# Patient Record
Sex: Male | Born: 1970 | Race: White | Hispanic: No | Marital: Married | Smoking: Never smoker
Health system: Southern US, Community
[De-identification: ages and names within clinical notes are randomized; demographics above are authoritative.]

## PROBLEM LIST (undated history)

## (undated) DIAGNOSIS — M1990 Unspecified osteoarthritis, unspecified site: Secondary | ICD-10-CM

## (undated) DIAGNOSIS — I10 Essential (primary) hypertension: Secondary | ICD-10-CM

## (undated) DIAGNOSIS — I499 Cardiac arrhythmia, unspecified: Secondary | ICD-10-CM

## (undated) DIAGNOSIS — M2550 Pain in unspecified joint: Secondary | ICD-10-CM

## (undated) DIAGNOSIS — R112 Nausea with vomiting, unspecified: Secondary | ICD-10-CM

## (undated) DIAGNOSIS — K219 Gastro-esophageal reflux disease without esophagitis: Secondary | ICD-10-CM

## (undated) DIAGNOSIS — N400 Enlarged prostate without lower urinary tract symptoms: Secondary | ICD-10-CM

## (undated) DIAGNOSIS — N200 Calculus of kidney: Secondary | ICD-10-CM

## (undated) DIAGNOSIS — Z9889 Other specified postprocedural states: Secondary | ICD-10-CM

## (undated) DIAGNOSIS — M797 Fibromyalgia: Secondary | ICD-10-CM

## (undated) DIAGNOSIS — K760 Fatty (change of) liver, not elsewhere classified: Secondary | ICD-10-CM

## (undated) DIAGNOSIS — E119 Type 2 diabetes mellitus without complications: Secondary | ICD-10-CM

## (undated) DIAGNOSIS — N189 Chronic kidney disease, unspecified: Secondary | ICD-10-CM

## (undated) HISTORY — PX: ROTATOR CUFF REPAIR: SHX139

## (undated) HISTORY — PX: TONSILLECTOMY: SUR1361

## (undated) HISTORY — PX: BACK SURGERY: SHX140

## (undated) HISTORY — PX: LITHOTRIPSY: SUR834

## (undated) HISTORY — PX: OTHER SURGICAL HISTORY: SHX169

---

## 1999-06-25 ENCOUNTER — Ambulatory Visit (HOSPITAL_COMMUNITY): Admission: RE | Admit: 1999-06-25 | Discharge: 1999-06-25 | Payer: Self-pay | Admitting: Urology

## 1999-06-25 ENCOUNTER — Encounter: Payer: Self-pay | Admitting: Urology

## 1999-08-05 ENCOUNTER — Encounter: Admission: RE | Admit: 1999-08-05 | Discharge: 1999-08-05 | Payer: Self-pay | Admitting: Infectious Diseases

## 1999-08-26 ENCOUNTER — Encounter: Admission: RE | Admit: 1999-08-26 | Discharge: 1999-08-26 | Payer: Self-pay | Admitting: Infectious Diseases

## 2000-05-20 ENCOUNTER — Ambulatory Visit (HOSPITAL_COMMUNITY): Admission: RE | Admit: 2000-05-20 | Discharge: 2000-05-20 | Payer: Self-pay | Admitting: Neurosurgery

## 2000-05-20 ENCOUNTER — Encounter: Payer: Self-pay | Admitting: Neurosurgery

## 2001-05-25 ENCOUNTER — Emergency Department (HOSPITAL_COMMUNITY): Admission: EM | Admit: 2001-05-25 | Discharge: 2001-05-26 | Payer: Self-pay | Admitting: Emergency Medicine

## 2002-09-20 ENCOUNTER — Encounter: Payer: Self-pay | Admitting: Neurosurgery

## 2002-09-20 ENCOUNTER — Encounter: Admission: RE | Admit: 2002-09-20 | Discharge: 2002-09-20 | Payer: Self-pay | Admitting: Neurosurgery

## 2002-10-04 ENCOUNTER — Encounter: Payer: Self-pay | Admitting: Neurosurgery

## 2002-10-04 ENCOUNTER — Encounter: Admission: RE | Admit: 2002-10-04 | Discharge: 2002-10-04 | Payer: Self-pay | Admitting: Neurosurgery

## 2002-10-12 ENCOUNTER — Encounter
Admission: RE | Admit: 2002-10-12 | Discharge: 2003-01-10 | Payer: Self-pay | Admitting: Physical Medicine & Rehabilitation

## 2003-01-12 ENCOUNTER — Encounter
Admission: RE | Admit: 2003-01-12 | Discharge: 2003-04-12 | Payer: Self-pay | Admitting: Physical Medicine & Rehabilitation

## 2003-01-25 ENCOUNTER — Encounter: Payer: Self-pay | Admitting: Neurosurgery

## 2003-01-25 ENCOUNTER — Ambulatory Visit (HOSPITAL_COMMUNITY): Admission: RE | Admit: 2003-01-25 | Discharge: 2003-01-26 | Payer: Self-pay | Admitting: Neurosurgery

## 2003-03-12 ENCOUNTER — Emergency Department (HOSPITAL_COMMUNITY): Admission: EM | Admit: 2003-03-12 | Discharge: 2003-03-12 | Payer: Self-pay | Admitting: Emergency Medicine

## 2003-07-19 ENCOUNTER — Encounter
Admission: RE | Admit: 2003-07-19 | Discharge: 2003-10-17 | Payer: Self-pay | Admitting: Physical Medicine & Rehabilitation

## 2003-12-11 ENCOUNTER — Encounter
Admission: RE | Admit: 2003-12-11 | Discharge: 2004-03-10 | Payer: Self-pay | Admitting: Physical Medicine & Rehabilitation

## 2007-04-19 ENCOUNTER — Encounter
Admission: RE | Admit: 2007-04-19 | Discharge: 2007-07-18 | Payer: Self-pay | Admitting: Physical Medicine & Rehabilitation

## 2007-04-20 ENCOUNTER — Ambulatory Visit: Payer: Self-pay | Admitting: Physical Medicine & Rehabilitation

## 2007-05-03 ENCOUNTER — Encounter
Admission: RE | Admit: 2007-05-03 | Discharge: 2007-06-10 | Payer: Self-pay | Admitting: Physical Medicine & Rehabilitation

## 2007-05-26 ENCOUNTER — Ambulatory Visit: Payer: Self-pay | Admitting: Physical Medicine & Rehabilitation

## 2007-06-11 ENCOUNTER — Ambulatory Visit: Payer: Self-pay | Admitting: Physical Medicine & Rehabilitation

## 2007-06-21 ENCOUNTER — Encounter: Admission: RE | Admit: 2007-06-21 | Discharge: 2007-09-19 | Payer: Self-pay | Admitting: Anesthesiology

## 2007-06-22 ENCOUNTER — Ambulatory Visit: Payer: Self-pay | Admitting: Anesthesiology

## 2007-06-29 ENCOUNTER — Ambulatory Visit (HOSPITAL_COMMUNITY)
Admission: RE | Admit: 2007-06-29 | Discharge: 2007-06-29 | Payer: Self-pay | Admitting: Physical Medicine & Rehabilitation

## 2007-08-04 ENCOUNTER — Ambulatory Visit: Payer: Self-pay | Admitting: Physical Medicine & Rehabilitation

## 2007-08-04 ENCOUNTER — Encounter
Admission: RE | Admit: 2007-08-04 | Discharge: 2007-11-02 | Payer: Self-pay | Admitting: Physical Medicine & Rehabilitation

## 2007-12-08 ENCOUNTER — Encounter
Admission: RE | Admit: 2007-12-08 | Discharge: 2008-03-07 | Payer: Self-pay | Admitting: Physical Medicine & Rehabilitation

## 2007-12-09 ENCOUNTER — Ambulatory Visit: Payer: Self-pay | Admitting: Physical Medicine & Rehabilitation

## 2008-01-28 ENCOUNTER — Ambulatory Visit: Payer: Self-pay | Admitting: Physical Medicine & Rehabilitation

## 2008-04-19 ENCOUNTER — Encounter
Admission: RE | Admit: 2008-04-19 | Discharge: 2008-07-18 | Payer: Self-pay | Admitting: Physical Medicine & Rehabilitation

## 2008-04-21 ENCOUNTER — Ambulatory Visit: Payer: Self-pay | Admitting: Physical Medicine & Rehabilitation

## 2008-04-28 ENCOUNTER — Ambulatory Visit: Payer: Self-pay | Admitting: Physical Medicine & Rehabilitation

## 2008-05-22 ENCOUNTER — Encounter: Admission: RE | Admit: 2008-05-22 | Discharge: 2008-08-04 | Payer: Self-pay | Admitting: Anesthesiology

## 2008-05-23 ENCOUNTER — Ambulatory Visit: Payer: Self-pay | Admitting: Anesthesiology

## 2008-06-20 ENCOUNTER — Ambulatory Visit: Payer: Self-pay | Admitting: Anesthesiology

## 2008-07-04 ENCOUNTER — Ambulatory Visit: Payer: Self-pay | Admitting: Anesthesiology

## 2008-07-07 HISTORY — PX: CERVICAL FUSION: SHX112

## 2008-08-01 ENCOUNTER — Encounter
Admission: RE | Admit: 2008-08-01 | Discharge: 2008-08-04 | Payer: Self-pay | Admitting: Physical Medicine & Rehabilitation

## 2008-08-01 ENCOUNTER — Ambulatory Visit: Payer: Self-pay | Admitting: Physical Medicine & Rehabilitation

## 2008-08-02 ENCOUNTER — Ambulatory Visit: Payer: Self-pay | Admitting: Cardiology

## 2008-08-03 ENCOUNTER — Ambulatory Visit: Payer: Self-pay

## 2008-08-03 ENCOUNTER — Encounter: Payer: Self-pay | Admitting: Cardiology

## 2008-08-03 ENCOUNTER — Ambulatory Visit: Payer: Self-pay | Admitting: Cardiology

## 2008-08-14 ENCOUNTER — Ambulatory Visit: Payer: Self-pay | Admitting: Cardiology

## 2009-01-22 ENCOUNTER — Encounter
Admission: RE | Admit: 2009-01-22 | Discharge: 2009-04-22 | Payer: Self-pay | Admitting: Physical Medicine & Rehabilitation

## 2009-01-23 ENCOUNTER — Ambulatory Visit: Payer: Self-pay | Admitting: Physical Medicine & Rehabilitation

## 2009-01-26 ENCOUNTER — Ambulatory Visit (HOSPITAL_COMMUNITY)
Admission: RE | Admit: 2009-01-26 | Discharge: 2009-01-26 | Payer: Self-pay | Admitting: Physical Medicine & Rehabilitation

## 2009-02-14 ENCOUNTER — Inpatient Hospital Stay (HOSPITAL_COMMUNITY): Admission: RE | Admit: 2009-02-14 | Discharge: 2009-02-16 | Payer: Self-pay | Admitting: Neurosurgery

## 2009-04-12 ENCOUNTER — Encounter: Admission: RE | Admit: 2009-04-12 | Discharge: 2009-07-05 | Payer: Self-pay | Admitting: Neurosurgery

## 2009-10-16 ENCOUNTER — Ambulatory Visit: Payer: Self-pay | Admitting: Internal Medicine

## 2009-10-16 DIAGNOSIS — I491 Atrial premature depolarization: Secondary | ICD-10-CM | POA: Insufficient documentation

## 2010-08-04 LAB — CONVERTED CEMR LAB
BUN: 10 mg/dL (ref 6–23)
CO2: 28 meq/L (ref 19–32)
Eosinophils Relative: 0.5 % (ref 0.0–5.0)
GFR calc Af Amer: 107 mL/min
Glucose, Bld: 91 mg/dL (ref 70–99)
HCT: 44.5 % (ref 39.0–52.0)
Hemoglobin: 15.5 g/dL (ref 13.0–17.0)
Lymphocytes Relative: 25.5 % (ref 12.0–46.0)
Monocytes Absolute: 0.5 10*3/uL (ref 0.1–1.0)
Monocytes Relative: 10.1 % (ref 3.0–12.0)
Platelets: 182 10*3/uL (ref 150–400)
Potassium: 4.6 meq/L (ref 3.5–5.1)
WBC: 5 10*3/uL (ref 4.5–10.5)

## 2010-08-08 NOTE — Assessment & Plan Note (Signed)
Summary: new eval/palpitations/amber   Primary Provider:  Dr Dara Hoyer  CC:  palpitations.  History of Present Illness: [name] [name] is seen at the request of Dr. Andrey Campanile because of palpitations. He had been intermittent for about 15 years. They have been very much more prominent in the last week and a half. They're associated with fatigue and need to cough or shortness of breath and lightheadedness.  He also had a history of chest discomfort. He underwent stress echo by Dr. Myrtis Ser about a year ago which was normal. Routine blood work was drawn by his primary care physician about 4 weeks ago. He does not think there is any abnormal in these labs except for his cholesterol .   Rhythms were reviewed. There is frequent ectopy. There occasionally interpolated beats. They're both narrow and wide beats. Both are associated with compensatory pause. The RR interval of the wide beats is shorter compared to the neuro beats. I suspect these represent aberration  Current Medications (verified): 1)  Metoprolol Tartrate 50 Mg Tabs (Metoprolol Tartrate) .... Take One Tablet By Mouth Twice A Day 2)  Plaquenil 200 Mg Tabs (Hydroxychloroquine Sulfate) .... 2 Once Daily 3)  Zantac 150 Mg Tabs (Ranitidine Hcl) .... Two Times A Day 4)  Aspirin 81 Mg Tbec (Aspirin) .... Take One Tablet By Mouth Daily 5)  Doxycycline Hyclate 100 Mg Caps (Doxycycline Hyclate) .... Once Daily  Allergies (verified): 1)  ! * Shellfish  Vital Signs:  Patient profile:   40 year old male Height:      70 inches Weight:      195 pounds BMI:     28.08 Pulse rate:   74 / minute BP sitting:   122 / 86  (left arm) Cuff size:   regular  Vitals Entered By: [name] [name], RMA (October 16, 2009 12:41 PM)  Physical Exam  General:  Alert and oriented in no acute distress. HEENT  normal . Neck veins were flat; carotids brisk and full without bruits. No lymphadenopathy. Back without kyphosis. Lungs clear. Heart sounds regular without murmurs  or gallops. PMI nondisplaced. Abdomen soft with active bowel sounds without midline pulsation or hepatomegaly. Femoral pulses and distal pulses intact. Extremities were without clubbing cyanosis or edemaSkin warm and dry. Neurological exam grossly normal affect is normal   Impression & Recommendations:  Problem # 1:  SUPRAVENTRICULAR PREMATURE BEATS (ICD-427.61) I suspect that the PACs are responsible for his palpitations and that the wide complex beats are also supraventricular origin.  The fatigue is likely related to these and not related to his beta blockers as he has been on this for a long time.  We discussed the role of antiarrhythmic drug suppression for these. As an interim solution we will try adding verapamil 120 mg a day  2 intermediate dose of beta blocker. If this does not work we will try and use an antiarrhythmic drug. We discussed the potential for proarrhythmia. We will forward to getting the laboratories that were drawn last month. At this point will not repeat any. His updated medication list for this problem includes:    Metoprolol Tartrate 50 Mg Tabs (Metoprolol tartrate) .Marland Kitchen... Take one-half tablet by mouth twice a day    Aspirin 81 Mg Tbec (Aspirin) .Marland Kitchen... Take one tablet by mouth daily    Verapamil Hcl 120 Mg Tabs (Verapamil hcl) ..... One by mouth daily  Patient Instructions: 1)  Your physician has recommended you make the following change in your medication: decrease Metoprolol to 25mg (1/2 tablet)  two times a day and add Verapamil 120mg  one daily. 2)  Call next week and tell us how you are doing  [phone] Prescriptions: VERAPAMIL HCL 120 MG TABS (VERAPAMIL HCL) one by mouth daily  #30 x 6   Entered by:   Dennis Bast, RN, BSN   Authorized by:   Nathen May, MD, [facility]   Signed by:   Dennis Bast, RN, BSN on 10/16/2009   Method used:   Electronically to        [facility] # [phone]* (retail)       [address] [place], [place]  [address]        Ph: [phone]       Fax: [phone]   RxID:   [id number]

## 2010-10-12 LAB — BASIC METABOLIC PANEL
CO2: 28 mEq/L (ref 19–32)
Calcium: 9.7 mg/dL (ref 8.4–10.5)
Chloride: 107 mEq/L (ref 96–112)
Glucose, Bld: 93 mg/dL (ref 70–99)
Potassium: 4.5 mEq/L (ref 3.5–5.1)
Sodium: 141 mEq/L (ref 135–145)

## 2010-10-12 LAB — CBC
HCT: 45.5 % (ref 39.0–52.0)
Hemoglobin: 15.8 g/dL (ref 13.0–17.0)
MCHC: 34.6 g/dL (ref 30.0–36.0)
MCV: 93.5 fL (ref 78.0–100.0)
RDW: 13 % (ref 11.5–15.5)

## 2010-10-12 LAB — DIFFERENTIAL
Basophils Absolute: 0 10*3/uL (ref 0.0–0.1)
Basophils Relative: 1 % (ref 0–1)
Eosinophils Absolute: 0.1 10*3/uL (ref 0.0–0.7)
Eosinophils Relative: 2 % (ref 0–5)
Monocytes Absolute: 0.5 10*3/uL (ref 0.1–1.0)
Monocytes Relative: 12 % (ref 3–12)

## 2010-11-19 NOTE — Assessment & Plan Note (Signed)
The patient returns today.  I last saw him August 05, 2007.  He had  good result with C3, 4, 5, 6 medial branch blocks under fluoroscopic  guidance per Dr. Stevphen Rochester.  Curiously, he states that the tingling pain in  the arms has improved after those injections.  He continues to have some  paraspinal deep pain, which is aggravated by slouching, improved by  sitting up straight.   He continues to use TENS as well as tractions with benefit.  He had  tried over the door type traction, but did not have a good result, and  therefore has a pneumatic traction.   CURRENT MEDICATIONS:  1. Neurontin 300 b.i.d.  2. Vicodin 5/500, takes 1/2-1 a day.   Pain level is 1-3/10.  Pain worse toward the evening and at nighttime.  That is when he takes the Vicodin.  Pain improves with rest, heat and  other modalities noted above.  Relief from it is good.  No other  interval medical history.  Otherwise negative.   Blood pressure 110/76, pulse 67, respiratory rate 18, O2 saturation 98%  on room air.  GENERAL:  No acute distress.  Mood and affect appropriate.  NECK:  Mild tenderness to palpation in the thoracic paraspinals around  C2-3 area.  He has no exacerbation of pain with cervical flexion  extension.  He does reproduce the pain by slumping in the upper thoracic  area.   Motor strength is 5/5 throughout bilateral deltoids, triceps, biceps,  grip.  Sensation normal C5, 6, 7, 8, T1 dermatomes.  Deep tendon reflex  normal bilateral upper extremities.  He has no evidence of scapular  wasting.  No other skin rashes in the back area or cervical area.   IMPRESSION:  1. Cervical facet arthropathy.  2. Upper thoracic pain.  Question whether he may have an upper      thoracic disk.  His MRI done within the last couple of months was      of the cervical spine, did not note any problems with C7-T1.      Nothing in the report.  Looking at the films myself and looking      more specifically at T1-T2 area, I can  see down through the T1-T2      area and minimal disk bulge at that area, nothing that deforms the      cord.   PLAN:  Given that he is doing quite well overall, do not think we need  to repeat medial branch block.  Continue with TENS, his traction, use  Vicodin at nighttime only and try weaning off the Neurontin.  I will see  him back in three months.      Erick Colace, M.D.  Electronically Signed     AEK/MedQ  D:  09/02/2007 10:51:29  T:  09/02/2007 19:04:55  Job #:  [id number]

## 2010-11-19 NOTE — Assessment & Plan Note (Signed)
HISTORY:  [name] [name] follows up today.  He was last seen by me on January 28, 2008.  He has a history of cervical radiculitis as well as bilateral  facet syndrome.  He has had epidural steroid injections at C6-C7  approximately 1 year ago with some improvements.  He had right shoulder  pain more than left shoulder pain earlier this year.  He had good  results with cervical medial branch blocks.   Since I last saw him, he has had onset of left upper extremity pain,  which over the summer was not very severe, but then since 4 days ago,  has become very severe.  His comfort position is putting his hand on the  top of his head on the left side.  He has some numbness going into the  left thumb.  He is using his TENS and tractions.  His pain level is  elevated compared to last visit.  He denies any bowel or bladder  dysfunction.  No problems with his gait.  He had to take off 2 days of  work, which is unusual for him.  This pain is in the 6-8 range.  Looking  up tends to make it worse.   REVIEW OF SYSTEMS:  Positive for numbness and tingling into the middle  finger and looking upward.   He had a couple of Vicodin left, has been taking these about twice a  day.  He had 2-3 Relafen left, he tried these, did not seem to help,  heat and his TENS unit do help.   PHYSICAL EXAMINATION:  VITAL SIGNS:  Blood pressure 131/69, pulse 62,  respiratory rate 18, and O2 sats 98% on room air.  GENERAL:  A well-developed, well-nourished male in no acute distress.  Orientation x3.  Affect is alert.  Gait is normal.  EXTREMITIES:  Without edema.  Deep tendon reflexes are normal.  Bilateral upper and lower extremity sensation is mildly reduced in the  C6 dermatome on left side.  He has positive Spurling maneuver and really  just have him do it himself looking upwards and this was sufficient to  give him left upper extremity symptoms.   IMPRESSION:  Cervical radiculitis appears to involve both C6 and C7  nerve  roots.  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   He has no other neurologic signs at the current time and he is a little  bit better than he was a couple of days ago.   PLAN:  1. I have written him for a Medrol Dosepak that he can take over the      next week.  2. Give him a shot of Toradol 60 mg IM today.  3. Write for cyclobenzaprine 5 mg b.i.d.  4. Continue TENS unit as well as heat.   I will see him back in 1 week.  If not much better by then, we will  repeat his MRI of the cervical spine.  If he gets any worse, he should  call back.      Erick Colace, M.D.  Electronically Signed     AEK/MedQ  D:  04/21/2008 15:19:05  T:  04/22/2008 03:10:46  Job #:  [id number]   cc:   Hilda Lias, M.D.  Fax: [phone]   Teena Irani. Arlyce Dice, M.D.  Fax: [phone]

## 2010-11-19 NOTE — Assessment & Plan Note (Signed)
HISTORY:  The patient last seen by me per EMG which was performed  May 14, 2007.  Evaluated right upper and lower extremity numbness,  tingling and pain.  He had no evidence of right cervical or lumbar  radiculopathy.  No evidence of compressive median or ulnar neuropathy.  No evidence of compressive prior new or old tibial neuropathy.  His  lower extremity symptoms have improved.  His back pain has improved.  He  has been going to physical therapy for his neck and thought he was doing  better until a couple of days when he seemed to flare up again.  He has  been using some cervical traction.  He has finished a Medrol Dosepak  approximately 2 weeks ago.  He has had no new traumatic events.  He has  been working 40 hours a week, but really does not do anything physically  intensive.  He is mostly doing desk work.   His pain level is rated at 4/10 currently.  His sleep is fair.  Relief  from medications is fair.  He walks 30 minutes at a time and climbs  steps.  His neck range of motion is 50% forward flexion and extension,  rotation and bending.  His upper extremity strength is full.  His deep  tendon reflexes are normal in upper and lower extremities.  His back has  no tenderness to palpation.  He has tenderness in the upper medial  scapula border bilaterally as well as cervical paraspinal.   VITAL SIGNS:  Blood pressure 139/73, pulse 87, respiratory rate 18, sat  96% on room air.   IMPRESSION:  Cervical pain, question cervical disk versus facet  syndrome.  He does have myofascial pain as well.  He has had a cervical  MRI which he will bring in the next visit.  He has no EMG evidence of  radiculopathy.  His shoulder pain is likely referral pain.   PLAN:  1. We will repeat Medrol Dosepak.  2. Receive physical therapy.  3. Trigger point injections today.  4. Review his MRI.  5. See how he is doing after his Medrol Dosepak.  6. May need to set him up for cervical facet injections  if not much      better in 2 weeks.      Erick Colace, M.D.  Electronically Signed     AEK/MedQ  D:  05/27/2007 16:49:53  T:  05/28/2007 10:23:20  Job #:  [id number]   cc:   Hilda Lias, M.D.  Fax: [phone]

## 2010-11-19 NOTE — Procedures (Signed)
[name], [name]                ACCOUNT NO.:  [id number]   MEDICAL RECORD NO.:  [id number]          PATIENT TYPE:  REC   LOCATION:  [facility]                          FACILITY:  [facility]   PHYSICIAN:  Celene Kras, MD        DATE OF BIRTH:  09/17/1970   DATE OF PROCEDURE:  07/27/2007  DATE OF DISCHARGE:                               OPERATIVE REPORT   [name] [name] is a patient that we have been following.  Well known to  me.  He thinks he is improved somewhat from his cervical epidural, but  has lateralizing pain and has difficulty with range of motion,  describing exam and historical features consistent with cervical facet  pain.  MRI reviewed.  1. From diagnostic therapeutic standpoint, it is reasonable to go onto      cervical facet medial branch intervention, at 4, 5 and 6, his most      problematic levels, C3 will be contributory, under local anesthetic      and he is consented.  I have used models and discussed in lay terms      and educated health care professional, questions were answered with      no barrier to communication.  2. Another rationale is to perform intervention and minimize      escalation of controlled substances.  3. I do not necessarily plan another intervention.  I would like him      to follow up and will determine further course of care.  He will      see Dr. Wynn Banker.   OBJECTIVE:  Diffuse paracervical myofascial discomfort with positive  cervical facetal compression test, I believe his pain is more prominent,  right greater than left.  Suprascapular pain extends to levator scapular  region with pain on flexion and extension.  Nothing new neurologically.   IMPRESSION:  Cervical facet syndrome, right and left side.  Degree  spinal disease, cervical spine.   PLAN:  Cervical facet medial branch intervention C4, C5 and C6, most  problematic levels, exam and historical features suggest.  C3 with  contributory innervation.  We will do this under local anesthetic  at the  medial branch, and he has consented.  I discussed with him, questions  were answered.   The patient taken to the fluoroscopy suite and placed in the supine  position.  Neck prepped and draped in the usual fashion.  Using a 25-  gauge needle under local anesthetic, I advanced the cervical facet at  the medial branch 3, 4, 5 and 6, contributory innervation addressed,  right and left side, under local anesthetic.  Confirmed placement, then  followed with 0.5 mL of lidocaine and 1% MPF at each level, a total of  40 mg Aristocort in divided dose.   Tolerated the procedure well.  He was recovered, modest vagal reaction,  but nothing unstable about his vital signs, and passed fairly quickly.  No complications identified.  Discharge instructions given.  Questions  were answered.           ______________________________  Celene Kras, MD  HH/MEDQ  D:  07/27/2007 12:14:35  T:  07/27/2007 15:28:26  Job:  [id number]

## 2010-11-19 NOTE — Assessment & Plan Note (Signed)
HISTORY:  A 40 year old male with neck pain radiating to the right  shoulder greater than the left shoulder.  He has had 2 Medrol Dosepaks  which were temporarily helpful for him.  He has had trigger point  injections which also help for him.  But then he had recurrence of pain.  His MRI has demonstrated spondylosis with a herniated disk C6-7, left  greater than right side, and spondylosis at the level of 5-6.  He has  gone through physical therapy.  He has not been using Vicodin except as  of the last few days.  He has undergone C6-7 translaminal cervical  epidural steroid injection per Dr. Celene Kras June 22, 2007.  He  was doing well until June 26, 2007, when he noted some increased  neck pain.  His wife also noticed what looked like a pimple near the  injection site.  He has had no sweats or chills.  He has had no recorded  fevers.  He has had no increased weakness of his arms.  He has had no  bowel or bladder problems.  He describes his pain as sharp and burning,  constant tingling, aching in the neck and down the right arm.  The  average pain is 5/10, currently 7/10.  Interfering with activity a 6/10  level.  Sleep is poor.  Relief from meds is fair.  He can walk several  minutes.  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   PHYSICAL EXAMINATION:  VITAL SIGNS:  Blood pressure 131/72, pulse 81,  respirations 18, oxygen sat 96% on room air.  GENERAL:  No acute distress, affect appropriate.  HEAD AND NECK:  He has a small pimple around the C7.  NEUROLOGIC:  His deep tendon reflexes are normal.  His strength is  normal in the upper extremities.  Sensation is normal.  Gait shows no  evidence of toe-dragging instability, no evidence of ataxia.  NECK:  Decreased range of motion but no meningeal signs.   IMPRESSION:  Flareup of neck pain; history of recent epidural steroid  injection of C6-7 with a localized skin irritation in that area, no  induration.   1. Will check MRI with and without contrast.  I did speak to Elie Goody from radiology about this.  We will get him in today.  2. Start him on Neurontin 300 t.i.d.  3. Continue hydrocodone as needed.  4. Keflex 500 q.i.d. x7 days.   I will see him back in a week.  I will have the results called to me.   Addendum:  Dr. Chestine Spore from radiology called results of MRI.  No evidence of  infection.  No cord signal changes.  Will compare film to previous.      Erick Colace, M.D.  Electronically Signed     AEK/MedQ  D:  06/29/2007 12:51:21  T:  06/29/2007 14:18:11  Job #:  [id number]   cc:   Hilda Lias, M.D.  Fax: [phone]   Celene Kras, MD  Fax: [phone]

## 2010-11-19 NOTE — Procedures (Signed)
[name] [name], [name] [name]                ACCOUNT NO.:  [id number]   MEDICAL RECORD NO.:  [id number]          PATIENT TYPE:  REC   LOCATION:  [facility]                          FACILITY:  [facility]   PHYSICIAN:  Celene Kras, MD        DATE OF BIRTH:  [birth year]   DATE OF PROCEDURE:  07/04/2008  DATE OF DISCHARGE:                               OPERATIVE REPORT   [facility].  We evaluated  him, and performed history and 14-point review of system.   1. I see him with Dr. Trecia Rogers, we discussed treatment, limitations,      and options complaining of more spondylitic pain.  The arm pain has      responded, his reasonable block is facets secondary to historical      elements, MRI findings, range of motion impairment, and exam      features.  2. Other modifiable features and health profile discussed.  Very      active engaging individual, should he be a strong responder, he may      be an RF candidate.  We will consider blocking this sequentially,      but I will give him rest from interventions.  He will see Dr.      Fritzi Mandes in about a month, consider going from lidocaine to      Marcaine, as we assess benchmarks.  I have given him those      benchmarks.  3. If he has any problems, he will let us know.  Expectations were      reviewed.   Nothing new neurologically.   Objectively, diffuse paracervical myofascial, positive cervical fetal  compression test left greater than right, suboccipital compression test  positive, suprascapular and levator scapular pain, nothing new  neurologically.   IMPRESSION:  Spondylosis.  Arm pain improved just above the cervical  spine.   PLAN:  Cervical facet medial branch intervention to the left side.  C4,  C5, C6, and C7 contributory innervation addressed, independent needle  access points under local anesthetic.  Predicated further intervention  based on need and overall response.  Questions were answered and  discussed in lay terms.  We have used models.   The patient was taken to operating suite and placed in supine position.  Neck prepped and draped in usual fashion.  Using a 25-gauge needle, I  advanced the cervical facet at the medial branch C4, C5, C6, and C7 with  contributory innervation.  Left side under local anesthetic.  Confirmed  placement and then inject 0.5 mL of lidocaine 1% MPF at each level with  total of 40 mg of Aristocort in divided dose.   Tolerated the procedure well.  No complications from procedure.  Appropriate coverage.  Discharge instructions given.           ______________________________  Celene Kras, MD    HH/MEDQ  D:  07/04/2008 11:39:56  T:  07/04/2008 23:59:12  Job:  [id number]

## 2010-11-19 NOTE — Assessment & Plan Note (Signed)
[name] [name] follows up today.  He has a history of cervical radiculitis as  well as bilateral cervical facet syndrome.  An epidural sterile  injection at C6-7 approximately 1 year ago with some improvement.  He  has had right shoulder greater than left shoulder pain earlier this  year, and had good results with cervical medial branch blocks.   When I last saw him 1 week ago, he had numbness in his middle finger  when looking upward.  Diagnosed with C7 radiculitis greater than C6.  He  has had improvement in his symptoms after Medrol Dosepak that he took  this last week and a Toradol shot 60 mg IM.   He has continued on the cyclobenzaprine 5 mg 1 p.o. b.i.d. p.r.n. as  well as TENS unit as well as heat as well as cervical traction that he  has at home.   His main concern is not so much of the neck pain right now as the left  upper extremity intermittent pain.   His pain goes down the posterior side of his arm into his elbow,  intermittent in nature, average pain is 5.  Sleep is fair.  Pain is  worse with sitting, activity, and standing as well as the brace  position.  Improves with rest, heat, medication, TENS, and massage.  Relief from meds is fair.   PHYSICAL EXAMINATION:  VITAL SIGNS:  Blood pressure 125/69, pulse 75,  respiratory rate 18, and O2 sat 96% on room air.  GENERAL:  A well-developed, well-nourished male in no acute distress.  Orientation x3.  Affect is bright.  Gait is normal.  EXTREMITIES:  Without edema.  Coordination normal.  Deep tendon reflexes  are normal.  He has no sensory deficits on examination.  He does have  paresthesias when he looks up at the ceiling and this goes down his left  arm into his left middle finger.   IMPRESSION:  Intermittent radiculitis, really positional, overall  improved.  After Medrol Dosepak, his neck pain has improved.   PLAN:  We will set him up for cervical epidural steroid injection.  Instructed if he had some progressive  deficits or some motor deficits,  we would need to get him in with Dr. Jeral Fruit, as well as re-image  cervical spine.  The patient is in agreement with this.      [name] [name], M.D.  Electronically Signed     AEK/MedQ  D:  04/28/2008 15:50:02  T:  04/29/2008 04:26:50  Job #:  [id number]   cc:   Hilda Lias, M.D.  Fax: [phone]

## 2010-11-19 NOTE — Assessment & Plan Note (Signed)
Mr. Uzzle returns today.  He has a history of cervical pain, mainly left-  sided periscapular.  He has underwent cervical medial branch blocks C4,  C5, C6, and C7.  This was performed on July 04, 2008.  He has had a  marked reduction of his pain since that time.  When I saw him last on  April 28, 2008, his pain level was 5/10.  He has undergone a couple of  epidural injections and then followed up with cervical medial branch and  has done well since that time.   Only medicine is intermittent Flexeril.  He is working full time.   PHYSICAL EXAMINATION:  GENERAL:  No acute stress.  Mood and affect  appropriate.  VITAL SIGNS:  His blood pressure is 123/70, pulse 70, respirations 18,  and O2 sat 98% on room air.  NEURO:  Orientation x3.  Affect is alert.  Gait is normal.  MUSCULOSKELETAL:  Extremities without edema.  Motor strength 5/5  bilateral upper extremities.  Neck range of motion is 75%, range forward  flexion, extension, lateral rotation, and bending.  Extension does still  cause him periscapular pain radiating to the shoulder.  He has no  sensory deficits.  His deep tendon reflexes are normal.   IMPRESSION:  1. Cervical spondylosis without myelopathy.  2. Cervical radiculitis, resolved.   PLAN:  I will see him back in a p.r.n. basis.  I refilled his Flexeril  #45, 10 mg dosage, 2 refills.   His Oswestry score today is 22%.      Erick Colace, M.D.  Electronically Signed     AEK/MedQ  D:  08/01/2008 09:56:16  T:  08/01/2008 23:42:06  Job #:  [id number]   cc:   Hilda Lias, M.D.  Fax: [phone]

## 2010-11-19 NOTE — Assessment & Plan Note (Signed)
REASON FOR VISIT:  Neck pain and right shoulder and arm pain.   Mr. Turman returns today.  He last saw me 05/27/07.  A 40 year old male  with neck pain, right lower extremity pain.  He brought in his cervical  MRI demonstrating a C5/C6 disk osteophyte complex greater than C6/7.  He  has abutment upon the spinal cord with decreased CSF at those levels but  no mild malacia or other cord changes.  He has had no bowel or bladder  disturbance, no gait disturbance.  He has intermittent right shoulder  pain, no left arm symptoms any longer.  He did well with the Medrol  Dosepak which he took between 05/27/07 and 06/03/07.  He had been doing  quite well in physical therapy and in fact returned to normal range of  motion, 0-10 pain, however, over the weekend had a flare up of his  symptomatology after a long drive and getting his son in and out of the  car seat while shopping.  He is a bit discouraged today. His average  pain is now up to a 3 and interferes at a mild to moderate level with  his general activity and sleep but does interfere quite a bit with  driving and has quite a bit of pain with driving.  He can walk 30  minutes at a time plus climb steps and he is independent with his self  care.  He has continued to work.   PHYSICAL EXAMINATION:  VITALS:  His blood pressure is 132/78, pulse 77,  respirations 16, O2 sat 97% room air.  GENERAL:  Mood and affect are appropriate.  NECK:  His neck range of motion is normal.  Flexion, extension, lateral  rotation, bending, Spurling's is negative.  He has tenderness in the right upper trap and right thoracic  paraspinal's at T3 and T4 levels.  He has normal upper extremity  strength and normal sensation, normal lower extremity strength and range  of motion as well as deep tendon reflexes.  Gait shows no toe drag or  knee instability, no ataxia.   Reviewing his MRI scan in addition above looked down to T3 level on some  cuts and showed no  abnormality of the thoracic cord.  The patient asked  me to look at this given that he has pain in the thoracic paraspinal.   IMPRESSION:  1. Cervical degenerative disk with some right C6 intermittent      radicular symptoms overall improving although he has a current      flare up.  2. Parascapular myofascial pain T3/T4 paraspinal's as well as upper      trapezius at the mid clavicular line.  3. May have concomitant facet syndrome.   PLAN:  1. Will not repeat Medrol Dosepak given he has had 2 in the last      couple months.  2. Trigger point injections today.  3. Will continue physical therapy for 2 more visits. He will get a      home traction unit which has been quite helpful for him and really      feels like it relieves his right shoulder, arm pain.  4. Reviewed medications.  He has got some Anorex at home and is doing      quite well with this just taking it      intermittently.  5. He will see Dr. Tonny Bollman in one week, evaluation for Cervical ESI      versus facets.  Erick Colace, M.D.  Electronically Signed     AEK/MedQ  D:  06/14/2007 16:30:39  T:  06/15/2007 07:41:58  Job #:  [id number]   cc:   Hilda Lias, M.D.  Fax: [phone]

## 2010-11-19 NOTE — Assessment & Plan Note (Signed)
Patient returns today.  I last saw him on September 02, 2007.  He had  right-sided cervical medial branch blocks per Dr. Stevphen Rochester on 07/27/07.  He had good relief of some of his right-sided neck pain with those  procedures.  He has had some improvement with muscular pain in the right  trapezius.  His main complaint at this time is left shoulder and left-  sided neck pain.  No numbness or tingling in the left arm.  He does have  some pain going down to the shoulder and elbow area but none below the  elbow.   TENS unit seems to help more than anything.  He has used cervical  traction, which is also helpful.   CURRENT MEDICATIONS:  Really not taking anything.   His pain level is about 3/10.  Sleep is fair.  The pain interferes with  activity at a 3/10 level, increases with sitting, especially at the  computer.   He does have some spasms in his back.   PHYSICAL EXAMINATION:  Blood pressure 125/71, pulse 66, respirations 18,  O2 sat 96% on room air.  NECK:  Reduced range of motion in turning as well as lateral bending  bilaterally as well as extension.  He has a pulling sensation with  flexion but no reduction in range of motion.  His motor strength is 5/5, bilateral deltoid, biceps/triceps grip as  well as in the lower extremities.  His deep tendon reflexes are normal  in bilateral upper and lower extremities.  He has normal sensation in  the upper extremities.  He has normal range of motion in the upper  extremities.  He has tenderness to palpation in the upper traps as well  as cervical paraspinal as well as medial scapular border as well as  infraspinatus fossa.   IMPRESSION:  Myofascial pain syndrome is the main problem that he is  experiencing at the current time.  I do not think that his arm pain is  radicular in nature, certainly no focal neurological deficits and no  numbness to pinprick.   PLAN:  1. We will do trigger point injections today.  2. If this is not particularly  helpful, will consider repeat cervical      facets but doing on the left side.  3. I will give him a prescription for hydrocodone 1 p.o. nightly since      this is causing some sleep disturbance, #30 for a month.  I will      see him back in 1-2 months, depending on his response to trigger      points.      [name] [name], M.D.  Electronically Signed     AEK/MedQ  D:  12/09/2007 16:56:17  T:  12/09/2007 18:06:34  Job #:  [id number]   cc:   [name] [name], M.D.  Fax: [phone]

## 2010-11-19 NOTE — Assessment & Plan Note (Signed)
A 40 year old male with neck pain radiating to right shoulder greater  than left shoulder. When I last saw him, he had small raised skin area  at the insertion site of the cervical epidural. He has been on Keflex  500 q.i.d. for seven days now and states that his wife noticed that the  injection site looks better. He has had no sweats or chills. No recorded  fevers. He had an MRI of the cervical spine last time I saw him on  December 23, when he had some redness around that area, but this showed  no sign of infection.  Comparison to the prior C-spine MRI, did not show  any significant differences primarily left-sided findings disc  osteophyte complex at C6-7 level, left greater than right and a central,  but mildly leftward protrusion C5-6. He had a small foraminal protrusion  C5-6 and C6-7 on the right side. This was not noted on the report.   His pain is better than when I saw him last. He is about a 4/10. He is  thinking about trying some golf, although I advised against it. His  sleep is fair. He is working 40 hours a week.   His blood pressure is 119/64, pulse 71, respiratory rate is 18, O2 sats  is 95% on room air.  GENERAL: In no acute distress. Mood and affect appropriate.  His neck shows no evidence of redness. There is a small healed scab at  the insertion site from the epidural. No tenderness or induration in  that area. His neck range of motion is about 50% in forward extension,  flexion, lateral rotation and bending. Negative meningeal signs.  His upper extremity strength and deep tendon reflexes are normal.  Sensation is normal.   IMPRESSION:  Cervical spondylosis, cervical degenerative discs.   PLAN:  1. Advised him to keep his appointment with Dr. Stevphen Rochester to try some      cervical facet injections.  2. Finish remainder of his Keflex which is basically through the end      of today.  3. Continue work as is.  4. Continue Neurontin 300 t.i.d.   I will see him back in  about one month.      Erick Colace, M.D.  Electronically Signed     AEK/MedQ  D:  07/06/2007 16:27:01  T:  07/06/2007 20:31:52  Job #:  [id number]   cc:   Hilda Lias, M.D.  Fax: [phone]

## 2010-11-19 NOTE — Procedures (Signed)
NAMETY, BUNTROCK                ACCOUNT NO.:  [id number]   MEDICAL RECORD NO.:  [id number]          PATIENT TYPE:  REC   LOCATION:  [facility]                          FACILITY:  [facility]   PHYSICIAN:  Erick Colace, M.D.DATE OF BIRTH:  July 24, 1970   DATE OF PROCEDURE:  05/27/2007  DATE OF DISCHARGE:                               OPERATIVE REPORT   [name] [name] has tender points bilateral levator scapula superior to the  medial border of the scapula two points at each side.  Pain is only  partially responsive to trigger point injection and medication  management.   Informed consent was obtained after describing risks and benefits of the  procedure to the patient including bleeding, bruising, and infection.  He elects to proceed and has given written consent.  The patient is  sitting.  Two areas over the medial levator scapula on the left side and  on the right side were marked and prepped with Betadine, entered with a  25-gauge 1-1/2-inch needle; 1 mL of 1% lidocaine injected at each site.  The patient tolerated the procedure well.  Post-injection instructions  given.      Erick Colace, M.D.  Electronically Signed     AEK/MEDQ  D:  05/27/2007 16:46:16  T:  05/28/2007 10:12:22  Job:  [id number]

## 2010-11-19 NOTE — H&P (Signed)
NAMEOLYVER, HAWES                ACCOUNT NO.:  [id number]   MEDICAL RECORD NO.:  [id number]          PATIENT TYPE:  OIB   LOCATION:  3039                         FACILITY:  [facility]   PHYSICIAN:  Hilda Lias, M.D.   DATE OF BIRTH:  07-05-1971   DATE OF ADMISSION:  02/14/2009  DATE OF DISCHARGE:                              HISTORY & PHYSICAL   [name] [name] is a gentleman who came to my office 2 days ago telling me  that the pain in the left arm was increasing.  He cannot sleep.  He  cannot move the left arm __________ .  He in the past had the diagnosis  of C5-6 and C6-7 spondylosis.  He had [facility] by [facility]  and he came up to the point that he was almost crying because of the  pain.  Previously, I made the diagnosis back in June 2008.   PAST MEDICAL HISTORY:  Lumbar diskectomy, sinus surgery.   ALLERGIES:  He is allergic to [facility].   SOCIAL HISTORY:  Negative.   FAMILY HISTORY:  Unremarkable.   REVIEW OF SYSTEMS:  Positive for neck pain with left upper extremity  pain.   PHYSICAL EXAMINATION:  GENERAL:  The patient came to my office and he  was __________ the pain.  He was holding the arm against the chest wall.  LUNGS:  Clear.  HEART:  Heart sounds normal.  ABDOMEN:  Normal.  EXTREMITIES:  Normal pulses.  NEUROLOGIC:  He has weakness of the biceps and triceps on the left side;  numbness of the thumb, index finger; and decrease __________.   Cervical x-ray shows spondylosis at the level C5-6, C6-7, left worse  than right side.   CLINICAL IMPRESSION:  C5-6, C6-7 spondylosis.   RECOMMENDATIONS:  The patient is being admitted for surgery.  The  procedure will be anterior diskectomy at the level C5-6 and C6-7,  followed by a plate.  The risks were explained to him including the  possibility of no improvement, need for further surgery, CSF leak, and  infection.           ______________________________  Hilda Lias, M.D.     EB/MEDQ  D:   02/14/2009  T:  02/15/2009  Job:  [id number]

## 2010-11-19 NOTE — Procedures (Signed)
NAMEPRITESH, [name]                ACCOUNT NO.:  [id number]   MEDICAL RECORD NO.:  [id number]         PATIENT TYPE:  APRM   LOCATION:                                 FACILITY:   PHYSICIAN:  Celene Kras, MD        DATE OF BIRTH:  06/02/1971   DATE OF PROCEDURE:  DATE OF DISCHARGE:                               OPERATIVE REPORT   [name].  I  evaluated him, reviewed history, and performed 14-point review of  systems.  He is an individual, 40 years old, who works as a Passenger transport manager, who is complaining of arm and neck pain.  It seems to be  getting worse, and he has seen Dr. Jeral Fruit, who would like [place] to trial  some cervical epidurals to see if we can assist in management, because  surgery might be an option.  He had excellent diminution in pain  perception and improved function and quality of life with facet  intervention.  We may go back and visit that, but as it is arm pain  today 5-6, 6-7 in distribution, it is reasonable to go on and inject and  then follow him expectantly.  If the arm pain improves, we would  consider going toward the facet intervention with RF as an option.   Objectively, diffuse paracervical myofascial, positive cervical facetal  compression test left greater than right.  Slightly decreased grip  strength left greater than right.  Pain with extension and side bending.  Suprascapular and levator scapular extension.  Nothing new  neurologically.   IMPRESSION:  Just positive cervical spine with arm pain.   PLAN:  Cervical epidural.  He is consented.   The patient was taken to the fluoroscopy suite and placed in the prone  position.  The neck is prepped and draped in the usual fashion.  Using a  Hustead needle, I advanced to C7-T1 interspace without any evidence of  CSF, heme, or paresthesia.  Test block uneventfully followed 40 mg  Aristocort and flushed the needle.   He tolerated the procedure well.  No  complications from our procedure.           ______________________________  Celene Kras, MD     HH/MEDQ  D:  05/23/2008 12:52:33  T:  05/24/2008 01:39:48  Job:  [id number]

## 2010-11-19 NOTE — Procedures (Signed)
[name] [name], [name] [name]                ACCOUNT NO.:  [id number]   MEDICAL RECORD NO.:  [id number]          PATIENT TYPE:  REC   LOCATION:  [facility]                          FACILITY:  [facility]   PHYSICIAN:  Erick Colace, M.D.DATE OF BIRTH:  Dec 28, 1970   DATE OF PROCEDURE:  DATE OF DISCHARGE:                               OPERATIVE REPORT   PROCEDURE:  A trigger point injection left upper trap, left  infraspinatus and left rhomboids.   INDICATIONS:  Myofascial pain syndrome.   Informed consent obtained after describing the risks and benefits of the  procedure to the patient.  These include bleeding, bruising and  infection.  He elects to proceed and has given written consent.  With  the patient in the seated position, the areas over the upper trapezius  at the level of C5-C7, as well as the rhomboids along the medial  scapular border and the levator scapula insertion at the upper medial  scapular border, as well as the infraspinatus in the midpoint of the  infraspinatus fossa were marked, prepped with Betadine and entered with  a 25-gauge 1.5 inch needle.  One-half mL of 1% lidocaine injected into  each site, followed by trigger point deactivation.  A positive twitch  sign was seen at the infraspinatus in multiple locations.  The patient  tolerated the procedure well and he had immediate relief of his  symptomatology.      Erick Colace, M.D.  Electronically Signed     AEK/MEDQ  D:  12/09/2007 16:52:19  T:  12/09/2007 20:35:17  Job:  [id number]

## 2010-11-19 NOTE — Procedures (Signed)
NAMESTIRLING, ORTON                ACCOUNT NO.:  [id number]   MEDICAL RECORD NO.:  [id number]          PATIENT TYPE:  REC   LOCATION:  [facility]                          FACILITY:  [facility]   PHYSICIAN:  Celene Kras, MD        DATE OF BIRTH:  12-06-1970   DATE OF PROCEDURE:  06/20/2008  DATE OF DISCHARGE:                               OPERATIVE REPORT   [name] [name].  I evaluated  her and reviewed the health and history form and 14-point review of  systems.   1. Lateralizing to the left, mixed spondylitic radicular pain.  I am      going on to the second epidural to see how he does, as the first      one had some benefit, and he wants to exhaust conservative      management prior to surgery.  We also want to minimize escalation      of controlled substances.  It is reasonable to go on to block, and      I cautioned him as to using medications at work.  He understands.      [place] engaging, he wants to be as active in his function as      possible.  2. I will go on to second epidural and if his arm pain is improved, I      would consider addressing the facet with RF as an option.  That      might be a best nonsurgical position to take.   Objectively, he has diffuse paracervical myofascial discomfort with  positive cervical facet compression test particularly left greater than  right.  Suprascapular and levator scapular pain.  Side bending.  Nothing  new neurologically.   IMPRESSION:  Degenerative spine disease, cervical spine with  spondylosis.   PLAN:  Cervical epidural.  He is consented.  He has arm pain as well  consistent with C5-C6, C6-C7.   The patient was taken to the fluoroscopy suite and placed in prone  position.  Neck was prepped and draped in usual fashion.  Using a  Hustead needle, I advanced the C7-T1 interspace without any evidence for  CSF, heme, or paresthesia.  Test block uneventfully followed 40 mg  Aristocort on  flush needle.   Tolerated the procedure well.  No complications from our procedure.  Discharge instructions given.  We will see him in followup.  Consider  cervical facet injection.           ______________________________  Celene Kras, MD     HH/MEDQ  D:  06/20/2008 14:41:29  T:  06/21/2008 03:53:04  Job:  [id number]

## 2010-11-19 NOTE — Assessment & Plan Note (Signed)
A 40 year old male, who I last saw in January has a history of cervical  pain, has responded well to medial branch blocks.  He has a history of  cervical radiculitis in the past causing primarily left shoulder pain  going into the elbow.  The previous episode radiculitis appeared to  involve C6-C7 nerve root and there was some foraminal stenosis on the  left-sided C5-6 and C6-7.  He has responded well in the past with Medrol  Dosepak.  At this time, he feels like he is having more problems with  weakness in the shoulder.  His neck really does not hurt so much.  He  has had no trauma.  No new incidents.  No bowel or bladder dysfunction.  No hand weakness.   He has had no fevers or chills.  No other medical illnesses.   His pain level is 6/10.  His sleep is fair.  Relief from meds is fair.  He is using mainly over-the-counter medication such as ibuprofen and  acetaminophen.   Other review is positive for numbness, tremor, tingling, and spasms.   PHYSICAL EXAMINATION:  VITAL SIGNS:  Blood pressure 134/82, pulse 84,  respirations 18, O2 sat 96% at room air.  GENERAL:  Well-developed, well-nourished male, in no acute distress.  Orientation x3.  Affect is alert.  Gait is normal.  EXTREMITIES:  He has normal sensation to pinprick and light touch in  bilateral upper and lower extremities.  He has 4/5 strength in the left  deltoid, left shoulder external rotators, left biceps, 5/5 in the  triceps, and the grip, 5/5 on the right side, 5/5 on the lower  extremities.  Gait is normal.  Deep tendon reflexes decreased, left  brachial radialis and biceps brachi 1/4 on the left and 2/4 on the  right.  Knee is 2/4 bilaterally.  Triceps is 1/4 bilaterally.   IMPRESSION:  Cervical radiculopathy C5-C6.  Curiously, his neck pain is  really not so much of an issue right now, it is related to the weakness  in the left arm.  He is getting some pain into the pectoralis area as  well.  I really do not think  this is cardiac.   He has typical C5-C6 weakness and deep tendon reflexes loss.  We will  send him for an MRI without contrast and then send him up for an  appointment with Dr. Jeral Fruit.  I discussed with the patient, agrees with  plan.  We will start a Medrol Dosepak today.  I gave him some Vicodin  5/500, 50 of them to take 1 or 2 a day.  He really typically does not  need that many, he does not take it while he is working.      Erick Colace, M.D.  Electronically Signed     AEK/MedQ  D:  01/23/2009 17:16:47  T:  01/24/2009 04:40:40  Job #:  [id number]   cc:   Hilda Lias, M.D.  Fax: [phone]

## 2010-11-19 NOTE — Procedures (Signed)
NAMEZYRUS, HETLAND                ACCOUNT NO.:  [id number]   MEDICAL RECORD NO.:  [id number]          PATIENT TYPE:  REC   LOCATION:  [facility]                          FACILITY:  [facility]   PHYSICIAN:  Celene Kras, MD        DATE OF BIRTH:  1970/07/16   DATE OF PROCEDURE:  06/22/2007  DATE OF DISCHARGE:                               OPERATIVE REPORT   1. [name] [name].  I have      evaluated the patient, history form, and 14 point review of      systems.  I reviewed the hard copy MRI.  I review progress to date,      overall directed care approach.  Suprascapular and levator scapular      pain.  A mixed mechanical discogenic pain, unknown primary      etiology.  He has also had some low back issues and probably this      is ongoing spinal axial degenerative components, and he is a      paramedic in administration.  I do think his job probably had      something to do with it in his youth, but at this time it is mostly      a symptomatic issue and he does not want to escalate controlled      substances.  No obvious neurological deficit.   1. As a broad brush stroke, I will start with a cervical epidural, and      then follow him expectantly.  He has some arm pain, particularly to      the left side, described as C6-C7.  Will follow up and consider      facet medial branch intervention, as RF might be an option but see      how he does at the central compartment.  The risks, complications,      and options were fully outlined, I used models and discussed in lay      terms.   Objectively, diffuse paracervical myofascial, positive cervical fetal  compression test, right left, suboccipital compression test positive,  range of motion impaired secondary to pain, side bending, nothing new  neurologically, typical pain, mechanical diskogenic.  He does have a  radicular overlay.   IMPRESSION:  Degenerative spine disease, cervical spine, cervical facet  syndrome.   PLAN:  Cervical epidural.  He is consented.   FOLLOW UP:  1. Predicated further intervention based on need.  2. Lidoderm as symptomatic control reviewed with him.  3. Review of medication appropriate.  He is consented.   The patient was taken to the fluoroscopy suite and placed in the prone  position.  His back was prepped and draped in the usual fashion. Using a  Husted needle advanced under direct fluoroscopic observation, C6-C7,  without any evidence of CSF, heme, or paresthesia.  Test block  uneventful followed with 40 mg Aristocort to flush the needle.  He  tolerated the procedure well.  No complications from our procedure.  Appropriate recovery.  Discharge instructions given.  No  barrier to  communication.          ______________________________  Celene Kras, MD    HH/MEDQ  D:  06/22/2007 13:46:11  T:  06/22/2007 21:06:51  Job:  [id number]

## 2010-11-19 NOTE — Assessment & Plan Note (Signed)
REASON FOR CONSULTATION:  Neck pain on the right with right upper  extremity pain and tingling in the hand, as well as a secondary  complaint of recent onset right lower extremity pain.   HISTORY:  Mr. Grainger is a 40 year old male.  He has a history of original  back injury in 1996 occurring at work at L4-L5 disks, treated  conservatively.  He was off of work for about 4 months, able to get back  to work without restrictions.  In 2003 he had left lower extremity pain  and weakness of the left great toe.  MRI showed L5-S1 herniated disk  compromising L5 and S1 nerve root.  He was treated initially with  physical therapy, medications, and spinal injections.  While he had some  improvement, he did not progress to the point where he could function in  his daily activities.  He, therefore, underwent L5-S1 diskectomy,  foraminotomy and decompression on January 28, 2003.  He had about a 10-month  postoperative recovery, but eventually he was able to go back to work  full time.  He had responded fairly well to Neurontin.  He had physical  therapy which was helpful postop.   I last saw him December 12, 2003, and since he was doing so well I was just  seeing him on a p.r.n. basis.   He had seen Dr. Jeral Fruit last in August of 2008 for both the neck pain and  right upper extremity pain, as well as low back and right lower  extremity pain.   In regards to his neck pain, it seems to be worse when he tilts his head  backward.  He has pain around the right shoulder blade.  He has some  numbness in the right fingers.  He cannot really tell which fingers are  the most numb, however.  He does have some morning type numbness and  pain in the right hand as well, but does not really wake him up at  night.  His average pain is about a 5 out of 10, but it interferes with  activity on a moderate level.  Poor posture seems to increase his neck  and shoulder pain.  His right lower extremity pain is increased by  walking.   He can walk several minutes at a time, but after 30 seconds,  he starts getting cramping in his right hip.  He can climb steps, he  drives.   His review of systems as above, also with numbness and tingling in the  right hand, spasms, he has some nausea, some urine retention problems,  and he states that he has been diagnosed with benign prostatic  hypertrophy even in his 20s.   SOCIAL HISTORY:  He is married and working full time.   FAMILY HISTORY:  High blood pressure.   MEDICATIONS:  1. Lopressor 50 mg b.i.d.  2. Plaquenil 400 mg daily by Dr. Willette Pa at [facility].      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  3. Vicodin.  He takes one half to one tablet per day.  4. Relafin 750 b.i.d.  5. Prilosec 20 mg a day.  6. Verapamil 90 mg a day.   His strength is the upper extremity is 5/5.  His neck range of motion is  about 50%, forward flexion, extension, lateral rotation and bending.  Extension is the most painful.  He has a negative Spurling's.  He has a  positive reverse Phalen's  in the right hand.  Negative Tinel's in the  wrist.  In the lower extremities he has normal strength in the hip flexion, knee  extension, or ankle dorsiflexor and great toe extensor.  Sensation is  normal in the lower extremities.  Deep tendon reflexes are normal in the  upper and lower extremities.  His back range of motion has limited forward flexion, about 50%, and  extension is full.  Lateral bending is about 50% as well.  His hip range of motion, knee and ankle range of motion are normal.  He  has no tenderness over the hips.   IMPRESSION:  1. Neck pain, right upper extremity pain appears to have neurogenic      component.  Somewhat of a confusing picture in that he has some      signs of carpal tunnel syndrome, but also cervical radiculopathy.      He does note a prior history of carpal tunnel syndrome that was      never EMG tested, but resolved on its own.   Given that this has      been going on for a couple of months, will check electromyography      and nerve conduction velocity for this.  2. Cervical degenerative disk, also causing referred pain in the      parascapular muscles, and some myofascial pain in the infraspinatus      and levator scapular, as well as rhomboids.  Will do trigger points      and send him for therapy.  He has already had some cervical      tractions which have not been helpful for his neck pain and we will      hold off on this.  3. In terms of his low back, we will address this further once the      neck pain is under somewhat better control.  4. In terms of medications, we will continue the Vicodin nightly, the      Relafin 750 b.i.d., and if he still has a significant neuropathic      component, we will add Neurontin next visit.      Erick Colace, M.D.  Electronically Signed     AEK/MedQ  D:  04/20/2007 09:40:36  T:  04/20/2007 14:51:13  Job #:  [id number]   cc:   Hilda Lias, M.D.  Fax: [phone]

## 2010-11-19 NOTE — Assessment & Plan Note (Signed)
[name] [name] is followed up today.  He has last been seen by me on December 09, 2007.  He had a trigger point injection on the left upper trapezius,  left infraspinatus, and left rhomboid.  Has done well after this.  He  has gotten back to golf.  He has gotten back to some yard work.  He has  been working full time.  He has occasional left upper extremity pain,  but this is mainly with activity and goes away with some rest.  No  progressive nature.   He has no right upper extremity symptomatology or right shoulder pain  anymore.  He has had bilateral C3, C4, C5, and C6 medial branch blocks  under fluoroscopic guidance per Dr. Stevphen Rochester and did not need any repeat  since that time in January 2009.   He is using TENS and traction.  His pain level is down to a 2/10 on  average.  Increases with just sitting around.  Relief from meds is good,  mainly with Tylenol and ibuprofen.  He occasionally has taken a half of  hydrocodone 5/500 at night.   PHYSICAL EXAMINATION:  GENERAL:  In no acute distress.  Mood and affect  appropriate.  VITAL SIGNS:  Blood pressure 127/69, pulse 73, respiratory rate 20, and  O2 saturation 97% on room air.  BACK:  No tenderness to palpation.  NECK:  No tenderness to palpation.  Cervical range of motion is normal.  MUSCULOSKELETAL:  Upper extremity strength is looking normal in deltoid,  biceps, and triceps.  Grip normal.  1+ deep tendon reflex bilateral  biceps, triceps, and brachioradialis.  There is no evidence of intrinsic  atrophy, no evidence of peripheral edema.   IMPRESSION:  1. Cervical radiculitis as well as cervical facet syndrome, improved.  2. Cervical myofascial pain syndrome, improved.   PLAN:  I have written hydrocodone 5/500, #30, depending on usage pattern  this may last 1-3 months.  He will take half a tablet at a time and only  has on average been taking it once or twice a week.  If he does well on  followup, we will just make it p.r.n.  He is to call  if he has any  significant exacerbation of his discomfort.      Erick Colace, M.D.  Electronically Signed     AEK/MedQ  D:  01/28/2008 13:40:10  T:  01/29/2008 05:08:48  Job #:  [id number]   cc:   Hilda Lias, M.D.  Fax: [phone]

## 2010-11-19 NOTE — Assessment & Plan Note (Signed)
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  He is quite active.  He has had some palpitations and fatigue and chest discomfort.  Dr.  Particia Lather of [facility] called today to see if we  could add him into our schedule and he is here today for complete  evaluation.   [name] [name] has no prior documented coronary disease.  He was seen by Dr.  Clydie Braun of the EP division at [facility] in 1994.  At  that time, he had PVCs.  I do not have those records.  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  He was placed on beta  blockade and he felt much better.  He did have a tilt table, which led  to many PVCs.  He had an epinephrine infusion also, which did not show  marked abnormalities.  Pheochromocytoma was ruled out.   He has had some problems with an undiagnosed type of arthritis  historically.  He is followed by Dr. Willette Pa of Rheumatology at  [facility].  He is on Plaquenil for this.   Recently, he has felt fatigue in general.  His wife feels that he has  been felt that he has looked pale.  He had some vague chest discomfort  with activity.  Several days ago while sitting at his desk, he noted  approximately 15 seconds of palpitations.  He thought that they were not  well perfused.  He did not have syncope or presyncope.  He is now here  for further followup.   PAST MEDICAL HISTORY:   ALLERGIES:  No known drug allergies.   MEDICATIONS:  1. Lopressor 50 b.i.d.  2. Zantac 50 b.i.d.  3. Verapamil 90 mg daily.  4. Plaquenil 400 mg daily.  5. Aspirin 81 mg daily.   OTHER MEDICAL PROBLEMS:  See the complete list below.   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  He does not smoke.  He drinks only one cup of coffee per  day.   FAMILY HISTORY:  There is no strong family history of coronary disease.   REVIEW OF SYSTEMS:  He is not having any headaches.  He is not having  any eye problems.  There is no marked shortness of breath.  Not having  any nausea or vomiting.  He has no significant musculoskeletal pains.  There are no fever or chills.  There are no skin rashes.  Otherwise, his  review of systems is negative.   PHYSICAL EXAMINATION:  VITAL SIGNS:  Blood pressure is 120/77 with a  pulse of 72.  GENERAL:  The patient is oriented to person, time, and place.  Affect is  normal.  HEENT:  Reveals no xanthelasma.  He has normal extraocular motion.  There are no carotid bruits.  There is no jugular venous distention.  LUNGS are clear.  Respiratory effort is not labored.  CARDIAC:  S1 with an S2.  There are no clicks or significant murmurs.  ABDOMEN:  Soft.  He has no peripheral edema.   EKG is normal.   PROBLEMS:  1. History of back surgery.  2. History of tonsillectomy and sinus surgery.  3. History of laser lithotripsy.  4. Gastroesophageal reflux disease.  5. Benign prostatic hypertrophy.  6. History of sinus tachycardia in the past with premature ventricular      contractions.  This was called beta-adrenergic disorder/syndrome by      Dr. Clydie Braun at [facility] in 1994 (per the patient).  7. Recent fatigue.  Etiology unclear.  8. Slight chest discomfort.  9. Sensation of tachycardia recently for 15-20 seconds with no good      pulse at the time per the patient.  10.History of a positive ANA and treatment with Plaquenil through Dr.      Willette Pa at [facility].  The patient has not seen her in several      years.   As of today, [name] [name] appears to be stable.  It will be important to  assess his symptoms.  There is no definite proof of coronary disease at  this time.  We will obtain a stress echo.  Also, he will have an  event  recorder.  I will also do CBC, BMET, and TSH.  I will try to review this  data as quickly as possible to be sure that it is safe for him to travel  to [place] next week.     Luis Abed, MD, [facility]  Electronically Signed    JDK/MedQ  DD: 08/02/2008  DT: 08/03/2008  Job #: [id number]   cc:   Teena Irani. Arlyce Dice, M.D. C/O [employer] [facility]

## 2010-11-19 NOTE — Op Note (Signed)
NAMEWOLFGANG, FINIGAN                ACCOUNT NO.:  [id number]   MEDICAL RECORD NO.:  [id number]          PATIENT TYPE:  OIB   LOCATION:  3039                         FACILITY:  [facility]   PHYSICIAN:  Hilda Lias, M.D.   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   PROCEDURE:  Anterior C5-6 and C6-7 diskectomy, decompression of the  spinal cord, foraminotomy, and interbody fusion with allograft and  autograft plate and microscope.   SURGEON:  Hilda Lias, M.D.   ASSISTANT:  Clydene Fake, M.D.   INDICATION:  The patient was seen in my office 2 days ago with worsening  of the pain, going to the left lower extremity.  The patient about 2  years ago had MRI which showed disk level C5-6 and C6-7 spondylosis.  He  declined surgery.  He has been seen by the pain clinic, but lately he is  unable to move his neck and the left arm.  Repeat MRI showed worsening  of this spondylosis and herniated disk at the level of C5-6 and C6-7.  Surgery was advised.   PROCEDURE IN DETAIL:  The patient was taken to the OR, and after  intubation, the left side of the neck was cleaned with DuraPrep.  Transverse incision was made through the skin, subcutaneous tissue,  platysma all the way down to the cervical area.  The x-ray showed that  indeed we were at the level of C5-6.  Then, the anterior ligament of C5-  6 and C6-7 was opened, we brought the microscope into the area.  With a  curette as well as a drill, we removed the disk completely.  We opened  the posterior ligament and there was some spondylosis going to right  side but there were ones going to left side which was associated with  herniated disk.  It was  like 3 fragments.  At the level of C6-7 was  found the same finding with a calcified disk which was also removed.  At  the end, we had good decompression of the spinal cord at both C6-C7  level.  The endplate was drilled and two piece of allograft, lordotic 7-  mm in height with autograft of BMX inside were inserted.  This was  followed by a plate using 6 screws.  Lateral cervical spine showed good  position of the plate and the graft.  Nevertheless, although we achieved  good hemostasis, we have quite a bit of bleeding since he did not stop  taking aspirin.  A drain was left.  Then, the wound was closed with  Vicryl and Steri-Strips.           ______________________________  Hilda Lias, M.D.     EB/MEDQ  D:  02/14/2009  T:  02/15/2009  Job:  [id number]

## 2010-11-19 NOTE — Assessment & Plan Note (Signed)
REASON FOR VISIT:  Neck pain.   The patient returns today.  He had a bilateral C3-C4-C5-C6 medial branch  block under fluoroscopic guidance per Dr. Stevphen Rochester.  This had a  significant, i.e., greater than 50% relief of pain lasting 4 or 5 days  at its maximum and now starting to wear off.  He thinks this really has  helped him more than anything thus far.  His pain is back to about a 3  out of 10, but with activity increases to a 4 out of 10.  His sleep is  fair.  His pain increases with driving and carrying his son, improves  with rest, heat, medication, TENS, and traction.  Because of some  twitching, mainly facial greater than limb, due to his Neurontin, he  reduced his dosage and symptoms have resolved.  He reduced his  medication dosage from 300 t.i.d. to 300 b.i.d. a couple of days ago.   His blood pressure is 119/77, pulse 56, respirations 18, O2 sat 98% on  room air.  GENERAL:  No acute distress.  Mood and affect appropriate.  Gait is  normal.  His neck has no tenderness to palpation.  His range of motion is 75%  forward flexion and extension, lateral rotation and bending.  Strength  in the upper extremities 5/5 in the deltoid, biceps/triceps grip, as  well as external rotation of the shoulder.  Sensation intact in the  upper extremities.  Deep tendon reflexes are 1+ bilateral upper  extremities and the biceps/triceps/brachioradialis.  Gait is normal.   IMPRESSION:  Cervical facet mediated pain as part of a broader cervical  spondylosis.  He does have some radicular component, but generally has  done better with the facet injection than with a C6-C7 cervical  epidural.  He has had some side effects from Neurontin, and these have  resolved with dosage reduction.   PLAN:  1. Continue Neurontin 300 b.i.d.  2. Send him back to Dr. Stevphen Rochester for a confirmatory set of cervical      medial branch blocks, C3-C4-C5-C6 level.  3. I will see him back in 1 month.      Erick Colace, M.D.  Electronically Signed     AEK/MedQ  D:  08/05/2007 10:12:19  T:  08/05/2007 12:13:05  Job #:  [id number]

## 2010-12-10 ENCOUNTER — Other Ambulatory Visit: Payer: Self-pay | Admitting: Neurosurgery

## 2010-12-10 DIAGNOSIS — M549 Dorsalgia, unspecified: Secondary | ICD-10-CM

## 2010-12-11 ENCOUNTER — Ambulatory Visit
Admission: RE | Admit: 2010-12-11 | Discharge: 2010-12-11 | Disposition: A | Discharge status: Home / Self Care | Payer: 59 | Source: Ambulatory Visit | Attending: Neurosurgery | Admitting: Neurosurgery

## 2010-12-11 DIAGNOSIS — M549 Dorsalgia, unspecified: Secondary | ICD-10-CM

## 2010-12-12 ENCOUNTER — Telehealth: Payer: Self-pay | Admitting: Internal Medicine

## 2010-12-12 NOTE — Telephone Encounter (Signed)
All Cardiac faxed to [facility] @ [phone]  12/12/10/km

## 2011-06-11 ENCOUNTER — Other Ambulatory Visit: Payer: Self-pay | Admitting: Anesthesiology

## 2011-06-11 ENCOUNTER — Ambulatory Visit
Admission: RE | Admit: 2011-06-11 | Discharge: 2011-06-11 | Disposition: A | Discharge status: Home / Self Care | Payer: 59 | Source: Ambulatory Visit | Attending: Anesthesiology | Admitting: Anesthesiology

## 2011-06-11 DIAGNOSIS — M545 Low back pain, unspecified: Secondary | ICD-10-CM

## 2011-06-11 DIAGNOSIS — IMO0002 Reserved for concepts with insufficient information to code with codable children: Secondary | ICD-10-CM

## 2011-07-31 ENCOUNTER — Other Ambulatory Visit: Payer: Self-pay | Admitting: Neurosurgery

## 2011-07-31 DIAGNOSIS — M549 Dorsalgia, unspecified: Secondary | ICD-10-CM

## 2011-08-01 ENCOUNTER — Other Ambulatory Visit: Payer: 59

## 2011-08-01 ENCOUNTER — Ambulatory Visit
Admission: RE | Admit: 2011-08-01 | Discharge: 2011-08-01 | Disposition: A | Discharge status: Home / Self Care | Payer: 59 | Source: Ambulatory Visit | Attending: Neurosurgery | Admitting: Neurosurgery

## 2011-08-01 DIAGNOSIS — M549 Dorsalgia, unspecified: Secondary | ICD-10-CM

## 2011-08-01 MED ORDER — MEPERIDINE HCL 100 MG/ML IJ SOLN
75.0000 mg | Freq: Once | INTRAMUSCULAR | Status: AC
  Administered 2011-08-01: 75 mg via INTRAMUSCULAR

## 2011-08-01 MED ORDER — DIAZEPAM 5 MG PO TABS
10.0000 mg | ORAL_TABLET | Freq: Once | ORAL | Status: AC
  Administered 2011-08-01: 10 mg via ORAL

## 2011-08-01 MED ORDER — ONDANSETRON HCL 4 MG/2ML IJ SOLN
4.0000 mg | Freq: Once | INTRAMUSCULAR | Status: AC
  Administered 2011-08-01: 4 mg via INTRAMUSCULAR

## 2011-08-01 MED ORDER — ONDANSETRON HCL 4 MG/2ML IJ SOLN: 4.0000 mg | Freq: Four times a day (QID) | INTRAMUSCULAR | Status: DC | PRN

## 2011-08-01 MED ORDER — IOHEXOL 180 MG/ML  SOLN
15.0000 mL | Freq: Once | INTRAMUSCULAR | Status: AC | PRN
  Administered 2011-08-01: 15 mL via INTRATHECAL

## 2011-08-07 ENCOUNTER — Other Ambulatory Visit: Payer: Self-pay | Admitting: Neurosurgery

## 2011-08-07 ENCOUNTER — Encounter (HOSPITAL_COMMUNITY): Payer: Self-pay | Admitting: Pharmacy Technician

## 2011-08-07 MED ORDER — CEFAZOLIN SODIUM-DEXTROSE 2-3 GM-% IV SOLR
2.0000 g | INTRAVENOUS | Status: DC
  Filled 2011-08-07: qty 50

## 2011-08-08 ENCOUNTER — Other Ambulatory Visit: Payer: Self-pay

## 2011-08-08 ENCOUNTER — Inpatient Hospital Stay (HOSPITAL_COMMUNITY)
Admission: RE | Admit: 2011-08-08 | Discharge: 2011-08-11 | DRG: 491 | Disposition: A | Discharge status: Home / Self Care | Payer: 59 | Source: Ambulatory Visit | Attending: Neurosurgery | Admitting: Neurosurgery

## 2011-08-08 ENCOUNTER — Ambulatory Visit (HOSPITAL_COMMUNITY): Payer: 59

## 2011-08-08 ENCOUNTER — Ambulatory Visit (HOSPITAL_COMMUNITY): Payer: 59 | Admitting: Certified Registered"

## 2011-08-08 ENCOUNTER — Encounter (HOSPITAL_COMMUNITY): Payer: Self-pay | Admitting: Certified Registered"

## 2011-08-08 ENCOUNTER — Encounter (HOSPITAL_COMMUNITY)
Admission: RE | Disposition: A | Discharge status: Home / Self Care | Payer: Self-pay | Source: Ambulatory Visit | Attending: Neurosurgery

## 2011-08-08 ENCOUNTER — Encounter (HOSPITAL_COMMUNITY): Payer: Self-pay | Admitting: *Deleted

## 2011-08-08 DIAGNOSIS — Z87442 Personal history of urinary calculi: Secondary | ICD-10-CM

## 2011-08-08 DIAGNOSIS — N400 Enlarged prostate without lower urinary tract symptoms: Secondary | ICD-10-CM | POA: Diagnosis present

## 2011-08-08 DIAGNOSIS — M5126 Other intervertebral disc displacement, lumbar region: Principal | ICD-10-CM | POA: Diagnosis present

## 2011-08-08 DIAGNOSIS — K219 Gastro-esophageal reflux disease without esophagitis: Secondary | ICD-10-CM | POA: Diagnosis present

## 2011-08-08 DIAGNOSIS — Z87892 Personal history of anaphylaxis: Secondary | ICD-10-CM

## 2011-08-08 DIAGNOSIS — N189 Chronic kidney disease, unspecified: Secondary | ICD-10-CM | POA: Diagnosis present

## 2011-08-08 DIAGNOSIS — Z7982 Long term (current) use of aspirin: Secondary | ICD-10-CM

## 2011-08-08 DIAGNOSIS — Z91013 Allergy to seafood: Secondary | ICD-10-CM

## 2011-08-08 DIAGNOSIS — Z981 Arthrodesis status: Secondary | ICD-10-CM

## 2011-08-08 DIAGNOSIS — Z79899 Other long term (current) drug therapy: Secondary | ICD-10-CM

## 2011-08-08 DIAGNOSIS — M47816 Spondylosis without myelopathy or radiculopathy, lumbar region: Secondary | ICD-10-CM

## 2011-08-08 HISTORY — DX: Other specified postprocedural states: R11.2

## 2011-08-08 HISTORY — DX: Calculus of kidney: N20.0

## 2011-08-08 HISTORY — DX: Gastro-esophageal reflux disease without esophagitis: K21.9

## 2011-08-08 HISTORY — DX: Benign prostatic hyperplasia without lower urinary tract symptoms: N40.0

## 2011-08-08 HISTORY — PX: LUMBAR LAMINECTOMY/DECOMPRESSION MICRODISCECTOMY: SHX5026

## 2011-08-08 HISTORY — DX: Other specified postprocedural states: Z98.890

## 2011-08-08 HISTORY — DX: Cardiac arrhythmia, unspecified: I49.9

## 2011-08-08 HISTORY — DX: Pain in unspecified joint: M25.50

## 2011-08-08 HISTORY — DX: Chronic kidney disease, unspecified: N18.9

## 2011-08-08 LAB — BASIC METABOLIC PANEL WITH GFR
BUN: 13 mg/dL (ref 6–23)
CO2: 28 meq/L (ref 19–32)
Calcium: 9.8 mg/dL (ref 8.4–10.5)
Chloride: 105 meq/L (ref 96–112)
Creatinine, Ser: 1 mg/dL (ref 0.50–1.35)
GFR calc Af Amer: >90 mL/min
GFR calc non Af Amer: >90 mL/min
Glucose, Bld: 90 mg/dL (ref 70–99)
Potassium: 4.1 meq/L (ref 3.5–5.1)
Sodium: 143 meq/L (ref 135–145)

## 2011-08-08 LAB — CBC
HCT: 43.9 % (ref 39.0–52.0)
Hemoglobin: 15.8 g/dL (ref 13.0–17.0)
MCH: 32.7 pg (ref 26.0–34.0)
MCHC: 36 g/dL (ref 30.0–36.0)
MCV: 90.9 fL (ref 78.0–100.0)
RBC: 4.83 MIL/uL (ref 4.22–5.81)

## 2011-08-08 LAB — SURGICAL PCR SCREEN: MRSA, PCR: NEGATIVE

## 2011-08-08 SURGERY — LUMBAR LAMINECTOMY/DECOMPRESSION MICRODISCECTOMY
Anesthesia: General | Site: Back | Laterality: Right | Wound class: Clean

## 2011-08-08 MED ORDER — OXYCODONE-ACETAMINOPHEN 5-325 MG PO TABS
1.0000 | ORAL_TABLET | ORAL | Status: DC | PRN
  Administered 2011-08-08 – 2011-08-09 (×2): 1 via ORAL
  Administered 2011-08-09 (×2): 2 via ORAL
  Administered 2011-08-09 – 2011-08-10 (×2): 1 via ORAL
  Administered 2011-08-10: 2 via ORAL
  Administered 2011-08-10 – 2011-08-11 (×3): 1 via ORAL
  Filled 2011-08-08: qty 1
  Filled 2011-08-08: qty 2
  Filled 2011-08-08: qty 1
  Filled 2011-08-08: qty 2
  Filled 2011-08-08: qty 1
  Filled 2011-08-08 (×2): qty 2
  Filled 2011-08-08 (×2): qty 1
  Filled 2011-08-08: qty 2

## 2011-08-08 MED ORDER — NEOSTIGMINE METHYLSULFATE 1 MG/ML IJ SOLN
INTRAMUSCULAR | Status: DC | PRN
  Administered 2011-08-08: 5 mg via INTRAVENOUS

## 2011-08-08 MED ORDER — MENTHOL 3 MG MT LOZG: 1.0000 | LOZENGE | OROMUCOSAL | Status: DC | PRN

## 2011-08-08 MED ORDER — PHENOL 1.4 % MT LIQD: 1.0000 | OROMUCOSAL | Status: DC | PRN

## 2011-08-08 MED ORDER — MUPIROCIN 2 % EX OINT
TOPICAL_OINTMENT | CUTANEOUS | Status: AC
  Administered 2011-08-08: 1 via NASAL
  Filled 2011-08-08: qty 22

## 2011-08-08 MED ORDER — HYDROMORPHONE HCL PF 1 MG/ML IJ SOLN
0.2500 mg | INTRAMUSCULAR | Status: DC | PRN
  Administered 2011-08-08 (×3): 0.5 mg via INTRAVENOUS

## 2011-08-08 MED ORDER — LACTATED RINGERS IV SOLN
INTRAVENOUS | Status: DC | PRN
  Administered 2011-08-08 (×2): via INTRAVENOUS

## 2011-08-08 MED ORDER — SODIUM CHLORIDE 0.9 % IV SOLN
INTRAVENOUS | Status: DC
  Administered 2011-08-08: 20:00:00 via INTRAVENOUS

## 2011-08-08 MED ORDER — ACETAMINOPHEN 650 MG RE SUPP: 650.0000 mg | RECTAL | Status: DC | PRN

## 2011-08-08 MED ORDER — SODIUM CHLORIDE 0.9 % IJ SOLN
3.0000 mL | Freq: Two times a day (BID) | INTRAMUSCULAR | Status: DC
  Administered 2011-08-09 – 2011-08-10 (×3): 3 mL via INTRAVENOUS

## 2011-08-08 MED ORDER — ZOLPIDEM TARTRATE 10 MG PO TABS: 10.0000 mg | ORAL_TABLET | Freq: Every evening | ORAL | Status: DC | PRN

## 2011-08-08 MED ORDER — ROCURONIUM BROMIDE 100 MG/10ML IV SOLN
INTRAVENOUS | Status: DC | PRN
  Administered 2011-08-08: 50 mg via INTRAVENOUS

## 2011-08-08 MED ORDER — SODIUM CHLORIDE 0.9 % IJ SOLN: 3.0000 mL | INTRAMUSCULAR | Status: DC | PRN

## 2011-08-08 MED ORDER — HYDROMORPHONE HCL PF 1 MG/ML IJ SOLN
INTRAMUSCULAR | Status: AC
  Filled 2011-08-08: qty 1

## 2011-08-08 MED ORDER — LUTEIN 20 MG PO CAPS: 20.0000 mg | ORAL_CAPSULE | Freq: Every day | ORAL | Status: DC

## 2011-08-08 MED ORDER — HEMOSTATIC AGENTS (NO CHARGE) OPTIME
TOPICAL | Status: DC | PRN
  Administered 2011-08-08: 1 via TOPICAL

## 2011-08-08 MED ORDER — THROMBIN 5000 UNITS EX SOLR
CUTANEOUS | Status: DC | PRN
  Administered 2011-08-08 (×2): 5000 [IU] via TOPICAL

## 2011-08-08 MED ORDER — PROPOFOL 10 MG/ML IV EMUL
INTRAVENOUS | Status: DC | PRN
  Administered 2011-08-08: 170 mg via INTRAVENOUS

## 2011-08-08 MED ORDER — CEFAZOLIN SODIUM 1-5 GM-% IV SOLN
1.0000 g | Freq: Three times a day (TID) | INTRAVENOUS | Status: AC
  Administered 2011-08-08 – 2011-08-09 (×2): 1 g via INTRAVENOUS
  Filled 2011-08-08 (×2): qty 50

## 2011-08-08 MED ORDER — MORPHINE SULFATE 4 MG/ML IJ SOLN: 4.0000 mg | INTRAMUSCULAR | Status: DC | PRN

## 2011-08-08 MED ORDER — METOPROLOL TARTRATE 50 MG PO TABS
50.0000 mg | ORAL_TABLET | Freq: Two times a day (BID) | ORAL | Status: DC
  Administered 2011-08-08 – 2011-08-11 (×5): 50 mg via ORAL
  Filled 2011-08-08 (×8): qty 1

## 2011-08-08 MED ORDER — SODIUM CHLORIDE 0.9 % IV SOLN: 250.0000 mL | INTRAVENOUS | Status: DC

## 2011-08-08 MED ORDER — ONDANSETRON HCL 4 MG/2ML IJ SOLN
INTRAMUSCULAR | Status: DC | PRN
  Administered 2011-08-08: 4 mg via INTRAVENOUS

## 2011-08-08 MED ORDER — HYDROXYCHLOROQUINE SULFATE 200 MG PO TABS
400.0000 mg | ORAL_TABLET | Freq: Every day | ORAL | Status: DC
  Administered 2011-08-08 – 2011-08-10 (×3): 400 mg via ORAL
  Filled 2011-08-08 (×4): qty 2

## 2011-08-08 MED ORDER — ONDANSETRON HCL 4 MG/2ML IJ SOLN
4.0000 mg | INTRAMUSCULAR | Status: DC | PRN
  Administered 2011-08-09 – 2011-08-10 (×2): 4 mg via INTRAVENOUS
  Filled 2011-08-08 (×2): qty 2

## 2011-08-08 MED ORDER — BUPIVACAINE LIPOSOME 1.3 % IJ SUSP
20.0000 mL | INTRAMUSCULAR | Status: DC
  Filled 2011-08-08: qty 20

## 2011-08-08 MED ORDER — DIAZEPAM 5 MG PO TABS
5.0000 mg | ORAL_TABLET | Freq: Four times a day (QID) | ORAL | Status: DC | PRN
  Administered 2011-08-08 – 2011-08-09 (×3): 5 mg via ORAL
  Filled 2011-08-08 (×3): qty 1

## 2011-08-08 MED ORDER — DROPERIDOL 2.5 MG/ML IJ SOLN: 0.6250 mg | INTRAMUSCULAR | Status: DC | PRN

## 2011-08-08 MED ORDER — 0.9 % SODIUM CHLORIDE (POUR BTL) OPTIME
TOPICAL | Status: DC | PRN
  Administered 2011-08-08: 1000 mL

## 2011-08-08 MED ORDER — BUPIVACAINE LIPOSOME 1.3 % IJ SUSP
INTRAMUSCULAR | Status: DC | PRN
  Administered 2011-08-08: 20 mL

## 2011-08-08 MED ORDER — FENTANYL CITRATE 0.05 MG/ML IJ SOLN
INTRAMUSCULAR | Status: DC | PRN
  Administered 2011-08-08 (×2): 100 ug via INTRAVENOUS

## 2011-08-08 MED ORDER — ACETAMINOPHEN 325 MG PO TABS
650.0000 mg | ORAL_TABLET | ORAL | Status: DC | PRN
  Filled 2011-08-08: qty 2

## 2011-08-08 MED ORDER — GLYCOPYRROLATE 0.2 MG/ML IJ SOLN
INTRAMUSCULAR | Status: DC | PRN
  Administered 2011-08-08: .8 mg via INTRAVENOUS

## 2011-08-08 MED ORDER — CEFAZOLIN SODIUM 1-5 GM-% IV SOLN
INTRAVENOUS | Status: DC | PRN
  Administered 2011-08-08: 2 g via INTRAVENOUS

## 2011-08-08 MED ORDER — PREGABALIN 75 MG PO CAPS
150.0000 mg | ORAL_CAPSULE | Freq: Two times a day (BID) | ORAL | Status: DC
  Administered 2011-08-08 – 2011-08-11 (×6): 150 mg via ORAL
  Filled 2011-08-08 (×2): qty 2
  Filled 2011-08-08: qty 1
  Filled 2011-08-08 (×2): qty 2
  Filled 2011-08-08: qty 1
  Filled 2011-08-08: qty 2

## 2011-08-08 MED ORDER — OCUVITE-LUTEIN PO CAPS
1.0000 | ORAL_CAPSULE | Freq: Every day | ORAL | Status: DC
  Administered 2011-08-08 – 2011-08-11 (×4): 1 via ORAL
  Filled 2011-08-08 (×4): qty 1

## 2011-08-08 MED ORDER — PANTOPRAZOLE SODIUM 40 MG PO TBEC
40.0000 mg | DELAYED_RELEASE_TABLET | Freq: Every day | ORAL | Status: DC
  Administered 2011-08-08 – 2011-08-11 (×4): 40 mg via ORAL
  Filled 2011-08-08 (×4): qty 1

## 2011-08-08 MED ORDER — MIDAZOLAM HCL 5 MG/5ML IJ SOLN
INTRAMUSCULAR | Status: DC | PRN
  Administered 2011-08-08 (×2): 1 mg via INTRAVENOUS

## 2011-08-08 MED ORDER — DROPERIDOL 2.5 MG/ML IJ SOLN
INTRAMUSCULAR | Status: DC | PRN
  Administered 2011-08-08: 0.625 mg via INTRAVENOUS

## 2011-08-08 SURGICAL SUPPLY — 59 items
BENZOIN TINCTURE PRP APPL 2/3 (GAUZE/BANDAGES/DRESSINGS) ×2 IMPLANT
BLADE SURG ROTATE 9660 (MISCELLANEOUS) IMPLANT
BUR ACORN 6.0 (BURR) ×2 IMPLANT
BUR MATCHSTICK NEURO 3.0 LAGG (BURR) ×2 IMPLANT
CANISTER SUCTION 2500CC (MISCELLANEOUS) ×2 IMPLANT
CLOTH BEACON ORANGE TIMEOUT ST (SAFETY) ×2 IMPLANT
CONT SPEC 4OZ CLIKSEAL STRL BL (MISCELLANEOUS) ×2 IMPLANT
DRAPE LAPAROTOMY 100X72X124 (DRAPES) ×2 IMPLANT
DRAPE MICROSCOPE LEICA (MISCELLANEOUS) ×2 IMPLANT
DRAPE POUCH INSTRU U-SHP 10X18 (DRAPES) ×2 IMPLANT
DRESSING TELFA 8X3 (GAUZE/BANDAGES/DRESSINGS) ×2 IMPLANT
DRSG PAD ABDOMINAL 8X10 ST (GAUZE/BANDAGES/DRESSINGS) IMPLANT
DURAFORM COLLAGEN 1X1 5-PACK (Neuro Prosthesis/Implant) ×2 IMPLANT
DURAPREP 26ML APPLICATOR (WOUND CARE) ×2 IMPLANT
DURASEAL SPINE SEALANT 3ML (MISCELLANEOUS) ×2 IMPLANT
ELECT REM PT RETURN 9FT ADLT (ELECTROSURGICAL) ×2
ELECTRODE REM PT RTRN 9FT ADLT (ELECTROSURGICAL) ×1 IMPLANT
GAUZE SPONGE 4X4 12PLY STRL LF (GAUZE/BANDAGES/DRESSINGS) ×2 IMPLANT
GAUZE SPONGE 4X4 16PLY XRAY LF (GAUZE/BANDAGES/DRESSINGS) IMPLANT
GLOVE BIOGEL M 8.0 STRL (GLOVE) ×2 IMPLANT
GLOVE BIOGEL PI IND STRL 8.5 (GLOVE) ×1 IMPLANT
GLOVE BIOGEL PI INDICATOR 8.5 (GLOVE) ×1
GLOVE ECLIPSE 7.5 STRL STRAW (GLOVE) ×6 IMPLANT
GLOVE ECLIPSE 8.5 STRL (GLOVE) ×2 IMPLANT
GLOVE EXAM NITRILE LRG STRL (GLOVE) IMPLANT
GLOVE EXAM NITRILE MD LF STRL (GLOVE) IMPLANT
GLOVE EXAM NITRILE XL STR (GLOVE) IMPLANT
GLOVE EXAM NITRILE XS STR PU (GLOVE) IMPLANT
GLOVE INDICATOR 7.5 STRL GRN (GLOVE) ×2 IMPLANT
GLOVE INDICATOR 8.0 STRL GRN (GLOVE) ×2 IMPLANT
GOWN BRE IMP SLV AUR LG STRL (GOWN DISPOSABLE) ×2 IMPLANT
GOWN BRE IMP SLV AUR XL STRL (GOWN DISPOSABLE) ×2 IMPLANT
GOWN STRL REIN 2XL LVL4 (GOWN DISPOSABLE) ×4 IMPLANT
KIT BASIN OR (CUSTOM PROCEDURE TRAY) ×2 IMPLANT
KIT ROOM TURNOVER OR (KITS) ×2 IMPLANT
NEEDLE HYPO 18GX1.5 BLUNT FILL (NEEDLE) IMPLANT
NEEDLE HYPO 21X1.5 SAFETY (NEEDLE) ×2 IMPLANT
NEEDLE HYPO 25X1 1.5 SAFETY (NEEDLE) ×2 IMPLANT
NEEDLE SPNL 20GX3.5 QUINCKE YW (NEEDLE) ×2 IMPLANT
NS IRRIG 1000ML POUR BTL (IV SOLUTION) ×2 IMPLANT
PACK LAMINECTOMY NEURO (CUSTOM PROCEDURE TRAY) ×2 IMPLANT
PAD ARMBOARD 7.5X6 YLW CONV (MISCELLANEOUS) ×6 IMPLANT
PATTIES SURGICAL .5 X1 (DISPOSABLE) ×2 IMPLANT
RUBBERBAND STERILE (MISCELLANEOUS) ×4 IMPLANT
SPONGE GAUZE 4X4 12PLY (GAUZE/BANDAGES/DRESSINGS) ×2 IMPLANT
SPONGE LAP 4X18 X RAY DECT (DISPOSABLE) IMPLANT
SPONGE SURGIFOAM ABS GEL SZ50 (HEMOSTASIS) ×2 IMPLANT
STRIP CLOSURE SKIN 1/2X4 (GAUZE/BANDAGES/DRESSINGS) ×2 IMPLANT
SUT VIC AB 0 CT1 18XCR BRD8 (SUTURE) IMPLANT
SUT VIC AB 0 CT1 8-18 (SUTURE)
SUT VIC AB 2-0 CP2 18 (SUTURE) IMPLANT
SUT VIC AB 3-0 SH 8-18 (SUTURE) IMPLANT
SYR 20CC LL (SYRINGE) ×2 IMPLANT
SYR 20ML ECCENTRIC (SYRINGE) ×2 IMPLANT
SYR 5ML LL (SYRINGE) IMPLANT
TAPE CLOTH SURG 4X10 WHT LF (GAUZE/BANDAGES/DRESSINGS) ×2 IMPLANT
TOWEL OR 17X24 6PK STRL BLUE (TOWEL DISPOSABLE) ×2 IMPLANT
TOWEL OR 17X26 10 PK STRL BLUE (TOWEL DISPOSABLE) ×2 IMPLANT
WATER STERILE IRR 1000ML POUR (IV SOLUTION) ×2 IMPLANT

## 2011-08-08 NOTE — Progress Notes (Signed)
Patient ID: [name] [name], male   DOB: [birth year], 41 y.o.   MRN: [id number] [name], no weakness. Some burning in right foot.

## 2011-08-08 NOTE — Anesthesia Preprocedure Evaluation (Addendum)
Anesthesia Evaluation  Patient identified by MRN, date of birth, ID band Patient awake    Reviewed: Allergy & Precautions, H&P , NPO status , Patient's Chart, lab work & pertinent test results, reviewed documented beta blocker date and time   History of Anesthesia Complications (+) PONV  Airway Mallampati: I TM Distance: >3 FB Neck ROM: Full    Dental  (+) Teeth Intact, Chipped and Dental Advisory Given Chipped right front tooth:   Pulmonary neg pulmonary ROS,  clear to auscultation  Pulmonary exam normal       Cardiovascular neg cardio ROS + dysrhythmias Regular Normal- Systolic murmurs    Neuro/Psych Negative Neurological ROS  Negative Psych ROS   GI/Hepatic Neg liver ROS, GERD-  Medicated and Controlled,  Endo/Other  Negative Endocrine ROS  Renal/GU negative Renal ROS     Musculoskeletal  (+) Arthritis -, Rheumatoid disorders,    Abdominal   Peds  Hematology   Anesthesia Other Findings   Reproductive/Obstetrics                          Anesthesia Physical Anesthesia Plan  ASA: II  Anesthesia Plan: General   Post-op Pain Management:    Induction: Intravenous  Airway Management Planned: Oral ETT  Additional Equipment:   Intra-op Plan:   Post-operative Plan:   Informed Consent: I have reviewed the patients History and Physical, chart, labs and discussed the procedure including the risks, benefits and alternatives for the proposed anesthesia with the patient or authorized representative who has indicated his/her understanding and acceptance.     Plan Discussed with: CRNA and Surgeon  Anesthesia Plan Comments:         Anesthesia Quick Evaluation

## 2011-08-08 NOTE — Progress Notes (Signed)
Stable. No weakness. Some burning.

## 2011-08-08 NOTE — Progress Notes (Signed)
Right l4 5 discectomy lysis of adhesions,foraminotomy.he l5 nerve was fibrotic,. Op note [phone]

## 2011-08-08 NOTE — Transfer of Care (Signed)
Immediate Anesthesia Transfer of Care Note  Patient: [name] [name]  Procedure(s) Performed:  LUMBAR LAMINECTOMY/DECOMPRESSION MICRODISCECTOMY - Right Lumbar Four-Five Diskectomy  Patient Location: PACU  Anesthesia Type: General  Level of Consciousness: awake, alert  and patient cooperative  Airway & Oxygen Therapy: Patient Spontanous Breathing and Patient connected to nasal cannula oxygen  Post-op Assessment: Report given to PACU RN, Post -op Vital signs reviewed and stable and Patient moving all extremities  Post vital signs: Reviewed and stable  Complications: No apparent anesthesia complications

## 2011-08-08 NOTE — H&P (Signed)
[name] [name] is an 41 y.o. male.   Chief Complaint: right leg pain HPI: lower back painfor several months. No better with conservative treatment including epidurals.  Past Medical History  Diagnosis Date  . PONV (postoperative nausea and vomiting)   . Dysrhythmia     ac/jc  . GERD (gastroesophageal reflux disease)   . Joint pain   . Hemorrhoids   . BPH (benign prostatic hyperplasia)   . Chronic kidney disease   . Kidney stones     Past Surgical History  Procedure Date  . Back surgery     2004 nd 2012  . Cervical fusion 2010  . Sinus sugery   . Lithotripsy   . Tonsillectomy     History reviewed. No pertinent family history. Social History:  reports that he has never smoked. He does not have any smokeless tobacco history on file. He reports that he does not drink alcohol or use illicit drugs.  Allergies:  Allergies  Allergen Reactions  . Shellfish-Derived Products Anaphylaxis    Medications Prior to Admission  Medication Dose Route Frequency Provider Last Rate Last Dose  . 0.9 % irrigation (POUR BTL)    PRN Karn Cassis, MD   1,000 mL at 08/08/11 1429  . ceFAZolin (ANCEF) IVPB 2 g/50 mL premix  2 g Intravenous 60 min Pre-Op Herby Abraham, PHARMD      . droperidol (INAPSINE) injection 0.625 mg  0.625 mg Intravenous PRN Remonia Richter, MD      . hemostatic agents    PRN Karn Cassis, MD   1 application at 08/08/11 1429  . HYDROmorphone (DILAUDID) injection 0.25-0.5 mg  0.25-0.5 mg Intravenous Q5 min PRN Remonia Richter, MD      . mupirocin ointment (BACTROBAN) 2 %        1 application at 08/08/11 1104  . thrombin spray    PRN Karn Cassis, MD   5,000 Units at 08/08/11 1428   No current outpatient prescriptions on file as of 08/08/2011.    Results for orders placed during the hospital encounter of 08/08/11 (from the past 48 hour(s))  BASIC METABOLIC PANEL     Status: Normal   Collection Time   08/08/11 10:45 AM      Component Value Range Comment   Sodium 143  135 - 145 (mEq/L)    Potassium 4.1  3.5 - 5.1 (mEq/L)    Chloride 105  96 - 112 (mEq/L)    CO2 28  19 - 32 (mEq/L)    Glucose, Bld 90  70 - 99 (mg/dL)    BUN 13  6 - 23 (mg/dL)    Creatinine, Ser 1.61  0.50 - 1.35 (mg/dL)    Calcium 9.8  8.4 - 10.5 (mg/dL)    GFR calc non Af Amer >90  >90 (mL/min)    GFR calc Af Amer >90  >90 (mL/min)   CBC     Status: Normal   Collection Time   08/08/11 10:45 AM      Component Value Range Comment   WBC 7.2  4.0 - 10.5 (K/uL)    RBC 4.83  4.22 - 5.81 (MIL/uL)    Hemoglobin 15.8  13.0 - 17.0 (g/dL)    HCT 09.6  04.5 - 40.9 (%)    MCV 90.9  78.0 - 100.0 (fL)    MCH 32.7  26.0 - 34.0 (pg)    MCHC 36.0  30.0 - 36.0 (g/dL)    RDW 13.0  11.5 - 15.5 (%)    Platelets 162  150 - 400 (K/uL)   SURGICAL PCR SCREEN     Status: Abnormal   Collection Time   08/08/11 10:54 AM      Component Value Range Comment   MRSA, PCR NEGATIVE  NEGATIVE     Staphylococcus aureus POSITIVE (*) NEGATIVE     Dg Chest 2 View  08/08/2011  *RADIOLOGY REPORT*  Clinical Data: Preop  CHEST - 2 VIEW  Comparison: 02/14/2009  Findings: Car mediastinal silhouette is within normal limits. Lungs are clear.  No pneumothorax and no pleural effusion.  IMPRESSION: No active cardiopulmonary disease.  Original Report Authenticated By: Donavan Burnet, M.D.    Review of Systems  Constitutional: Negative.   HENT: Negative.   Eyes: Negative.   Respiratory: Negative.   Cardiovascular: Negative.   Gastrointestinal: Negative.   Genitourinary: Negative.   Musculoskeletal: Positive for back pain.  Skin: Negative.   Neurological: Negative.   Endo/Heme/Allergies: Negative.     Blood pressure 131/78, pulse 72, temperature 97.9 F (36.6 C), temperature source Oral, resp. rate 20, height 5\' 10"  (1.778 m), weight 83.915 kg (185 lb), SpO2 99.00%. Physical Exam hent,nl. Neck, anterior scar. Lungs, nl. Cv,nl abdomen,nl. Extremities,nl.NEUROweakness of right df.AN NUMBNESS of foot SLR positive at  45 degrees   Assessment/Plan Myelogram hnp at 45 right. Patient to [facility] right 45 discectomy at RIGHT    Kasarah Sitts M 08/08/2011,

## 2011-08-08 NOTE — Anesthesia Postprocedure Evaluation (Signed)
Anesthesia Post Note  Patient: [name] [name]  Procedure(s) Performed:  LUMBAR LAMINECTOMY/DECOMPRESSION MICRODISCECTOMY - Right Lumbar Four-Five Diskectomy  Anesthesia type: general  Patient location: PACU  Post pain: Pain level controlled  Post assessment: Patient's Cardiovascular Status Stable  Last Vitals:  Filed Vitals:   08/08/11 1741  BP:   Pulse: 67  Temp:   Resp: 37    Post vital signs: Reviewed and stable  Level of consciousness: sedated  Complications: No apparent anesthesia complications

## 2011-08-09 MED ORDER — DIAZEPAM 5 MG PO TABS
5.0000 mg | ORAL_TABLET | Freq: Four times a day (QID) | ORAL | Status: DC | PRN
  Administered 2011-08-09: 10 mg via ORAL
  Administered 2011-08-09 – 2011-08-11 (×4): 5 mg via ORAL
  Filled 2011-08-09: qty 1
  Filled 2011-08-09: qty 2
  Filled 2011-08-09 (×3): qty 1

## 2011-08-09 MED ORDER — DOCUSATE SODIUM 100 MG PO CAPS
100.0000 mg | ORAL_CAPSULE | Freq: Two times a day (BID) | ORAL | Status: DC
  Administered 2011-08-09 – 2011-08-11 (×5): 100 mg via ORAL
  Filled 2011-08-09 (×5): qty 1

## 2011-08-09 MED ORDER — BISACODYL 10 MG RE SUPP
10.0000 mg | Freq: Every day | RECTAL | Status: DC | PRN
  Administered 2011-08-10 – 2011-08-11 (×2): 10 mg via RECTAL
  Filled 2011-08-09 (×2): qty 1

## 2011-08-09 MED ORDER — MAGNESIUM HYDROXIDE 400 MG/5ML PO SUSP
30.0000 mL | Freq: Every day | ORAL | Status: DC | PRN
  Administered 2011-08-10 – 2011-08-11 (×2): 30 mL via ORAL
  Filled 2011-08-09 (×3): qty 30

## 2011-08-09 MED ORDER — MAGNESIUM HYDROXIDE NICU ORAL SYRINGE 400 MG/5 ML: 30.0000 mL | Freq: Every day | ORAL | Status: DC | PRN

## 2011-08-09 NOTE — Op Note (Signed)
NAMELUCERO, [name] [name]                ACCOUNT NO.:  [id number]  MEDICAL RECORD NO.:  [id number]  LOCATION:  3006                         FACILITY:  [facility]  PHYSICIAN:  Hilda Lias, M.D.   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  PROCEDURE:  Right L4-5 diskectomy, lysis of adhesions, removal of fragments of disk right at the level of the axilla.  Microscope.  SURGEON:  Hilda Lias, MD  ASSISTANT SURGEON:  Stefani Dama, MD  CLINICAL HISTORY:  [name] [name] is a 41 year old gentleman, well known to me, who in the past, underwent surgery at L5-1, L4-,5 as well as cervical fusion.  Lately, he had been complaining of back pain with radiation to the right leg.  The pain was getting worse up to the point that we proceeded with a lumbar myelogram, which showed a large herniated disk at L4-5.  Surgery was advised.  All the risks of the surgery _were explained to_________ [name] [name] and his wife [name] as _________ infection, CSF leak, need for further surgery, worsening of the weakness, the possibility of having to have a lumbar fusion.  PROCEDURE:  The patient was taken to the OR and after intubation, he was positioned in a prone manner.  The back was cleaned with DuraPrep. Drapes were applied.  Midline incision resecting the previous one was made and incision was carried down straight to the spinous process.  The patient had quite a bit of scar tissue in the epidural area.  Retraction was made.  We brought the microscope into the area.  The patient, right at the level of the previous laminotomy, has  quite a bit of adhesion to the thecal sac.  Decompression was achieved.  With the drill, we drilled the lower lamina of L4 and the upper of L5.  Immediately, we found 2 things, one on the left that was thickening of the ligament with quite a bit of fibrosis and the fibrosis going into the _l45 foramen_________.  Once this was retracted, we found there was a herniated disk, once the ligament was into the foramen or into the axilla.  Lysis was accomplished with removal of disk.  There was an opening in the disk space.  Because of that, we proceeded with the diskectomy medially and laterally.  At the end, we had good decompression of the thecal sac as well as the L5 nerve root.  Although we achieved good decompression, we did a Valsalva maneuver up to 40.  There was no evidence of any CSF leak.  Nevertheless, because of the retraction, we used a piece of DuraGen and some surgical glue.   __________.  At the end, the area was irrigated.  The wound was closed with catgut and staples.  The patient is allergic to VICRYL.  ______________________________ Hilda Lias, M.D.     EB/MEDQ  D:  08/08/2011  T:  08/09/2011  Job:  [id number]

## 2011-08-09 NOTE — Progress Notes (Signed)
Physical Therapy Evaluation Patient Details Name: [name] [name] MRN: [id number] DOB: 1970/11/15 Today's Date: 08/09/2011  Problem List:  Patient Active Problem List  Diagnoses  . SUPRAVENTRICULAR PREMATURE BEATS    Past Medical History:  Past Medical History  Diagnosis Date  . PONV (postoperative nausea and vomiting)   . Dysrhythmia     ac/jc  . GERD (gastroesophageal reflux disease)   . Joint pain   . Hemorrhoids   . BPH (benign prostatic hyperplasia)   . Chronic kidney disease   . Kidney stones    Past Surgical History:  Past Surgical History  Procedure Date  . Back surgery     2004 nd 2012  . Cervical fusion 2010  . Sinus sugery   . Lithotripsy   . Tonsillectomy     PT Assessment/Plan/Recommendation PT Assessment Clinical Impression Statement: Pt presents with a medical diagnosis of L4-5 discectomy with right hip weakness and hip "buckling". Pt will benefit from skilled PT in the acute care setting in order to maximize functional mobility for a safe d/c home PT Recommendation/Assessment: Patient will need skilled PT in the acute care venue PT Problem List: Decreased strength;Decreased activity tolerance;Decreased mobility;Decreased knowledge of use of DME;Decreased knowledge of precautions;Pain PT Therapy Diagnosis : Generalized weakness;Difficulty walking PT Plan PT Frequency: Min 5X/week PT Treatment/Interventions: DME instruction;Gait training;Stair training;Functional mobility training;Therapeutic activities;Therapeutic exercise;Neuromuscular re-education;Patient/family education PT Recommendation Follow Up Recommendations: Home health PT;Supervision/Assistance - 24 hour Equipment Recommended: Rolling walker with 5" wheels;3 in 1 bedside comode PT Goals  Acute Rehab PT Goals PT Goal Formulation: With patient Time For Goal Achievement: 7 days Pt will go Sit to Stand: with modified independence PT Goal: Sit to Stand - Progress: Goal set today Pt will go Stand  to Sit: with modified independence PT Goal: Stand to Sit - Progress: Goal set today Pt will Transfer Bed to Chair/Chair to Bed: with supervision PT Transfer Goal: Bed to Chair/Chair to Bed - Progress: Goal set today Pt will Ambulate: >150 feet;with supervision;with least restrictive assistive device PT Goal: Ambulate - Progress: Goal set today Pt will Go Up / Down Stairs: 3-5 stairs;with min assist;with rail(s) PT Goal: Up/Down Stairs - Progress: Goal set today  PT Evaluation Precautions/Restrictions  Precautions Precautions: Back Precaution Comments: Educated patient on back precautions  Required Braces or Orthoses: No Prior Functioning  Home Living Lives With: Spouse Receives Help From: Family Type of Home: House Home Layout: Two level;Able to live on main level with bedroom/bathroom Alternate Level Stairs-Rails: Right Alternate Level Stairs-Number of Steps: 12 Home Access: Stairs to enter Entrance Stairs-Rails: Left Entrance Stairs-Number of Steps: 3 Bathroom Shower/Tub: Walk-in shower;Door [employer] Toilet: Standard Bathroom Accessibility: Yes How Accessible: Accessible via walker Home Adaptive Equipment: Built-in shower seat;None Prior Function Level of Independence: Independent with basic ADLs;Independent with gait;Independent with transfers Able to Take Stairs?: Yes Driving: Yes Vocation: Full time employment Leisure: Hobbies-yes (Comment) Cognition Cognition Arousal/Alertness: Awake/alert Overall Cognitive Status: Appears within functional limits for tasks assessed Orientation Level: Oriented X4 Sensation/Coordination Sensation Light Touch: Appears Intact Coordination Gross Motor Movements are Fluid and Coordinated: Yes (for UE's) Fine Motor Movements are Fluid and Coordinated: Yes (for UE's) Extremity Assessment RUE Assessment RUE Assessment: Within Functional Limits LUE Assessment LUE Assessment: Within Functional Limits RLE Assessment RLE Assessment:  Exceptions to [facility] RLE Strength RLE Overall Strength: Within Functional Limits for tasks assessed (except for those listed below) Right Hip Flexion: 4/5 Right Knee Flexion: 4/5 Right Knee Extension: 4/5 LLE Assessment LLE Assessment: Within Functional Limits  Mobility (including Balance) Bed Mobility Bed Mobility: Yes Rolling Left: 5: Supervision;With rail Rolling Left Details (indicate cue type and reason): VC for technique to maintain back precautions Left Sidelying to Sit: 5: Supervision Left Sidelying to Sit Details (indicate cue type and reason): VC for technique to maintain back precautions during transfer Transfers Transfers: Yes Sit to Stand: 4: Min assist;With upper extremity assist;From bed Sit to Stand Details (indicate cue type and reason): VC for hand placement for safety. Min assist for stability Stand to Sit: 4: Min assist;With upper extremity assist;To chair/3-in-1 Stand to Sit Details: VC for hand placement for safety Ambulation/Gait Ambulation/Gait: Yes Ambulation/Gait Assistance: 3: Mod assist Ambulation/Gait Assistance Details (indicate cue type and reason): VC for quad set during ambulation in which he was able to control his hip from "giving away" Pt still required mod assist for stability secodnary to weak hip. Ambulation Distance (Feet): 30 Feet Assistive device: Rolling walker Gait Pattern: Decreased hip/knee flexion - right;Decreased stance time - right;Step-to pattern;Decreased weight shift to right;Trunk flexed Gait velocity: Decreased cadence Stairs: No    Exercise    End of Session PT - End of Session Equipment Utilized During Treatment: Gait belt Activity Tolerance: Patient limited by fatigue;Patient limited by pain Patient left: in chair;with call bell in reach;with family/visitor present Nurse Communication: Mobility status for transfers;Mobility status for ambulation General Behavior During Session: [facility] for tasks performed Cognition: [facility] for  tasks performed  Milana Kidney 08/09/2011, 1:06 PM  08/09/2011 Milana Kidney DPT PAGER: [phone] OFFICE: [phone]

## 2011-08-09 NOTE — Progress Notes (Signed)
Patient was not able to tolerate ambulation this am. Patient stood at the bedside but was unsteady on the right foot. He c/o "something not feeling right". Leg was weak and patient c/o of nausea. Zofran given. Pt was put back in the bed. He is highly concerned of this and states he did not have this issue with previous surgeries. Reassured patient. Will pass off to am nurse.

## 2011-08-09 NOTE — Progress Notes (Signed)
CSW received consult for SNF placement. OT recommendation for no f/u needs noted. CSW signing off as no other CSW needs reported or noted. Please re-consult if SNF needed.  Dellie Burns, MSW, [place] [phone] (weekend)

## 2011-08-09 NOTE — Progress Notes (Signed)
Filed Vitals:   08/08/11 1832 08/08/11 2200 08/09/11 0200 08/09/11 0600  BP: 118/75 108/68 103/64 102/64  Pulse: 69 83 81 81  Temp: 97.9 F (36.6 C) 98.6 F (37 C) 98.7 F (37.1 C) 94.6 F (34.8 C)  TempSrc:  Oral Oral Oral  Resp: 16 16 18 18   Height:      Weight:      SpO2: 97% 95% 95% 95%    Patient sitting up in chair, has ordered been seen by PT and OT, has ambulate only to bathroom, right lower extremity did give way earlier this morning, using the rolling walker with gait belt.  Dressing is clean and dry. We'll DC dressing in AM. Strength is 5 over 5 in the iliopsoas, quadriceps, dorsiflexor, and plantar flexor.  Asking for Valium 10 mg at night and a stool softener.   Plan: Continue PT and OT. Encouraged to increase ambulation as tolerated.  Hewitt Shorts, MD 08/09/2011, 10:05 AM

## 2011-08-09 NOTE — Evaluation (Addendum)
Occupational Therapy Evaluation Patient Details Name: [name] [name] MRN: [id number] DOB: 05-Jan-1971 Today's Date: 08/09/2011  Problem List:  Patient Active Problem List  Diagnoses  . SUPRAVENTRICULAR PREMATURE BEATS    Past Medical History:  Past Medical History  Diagnosis Date  . PONV (postoperative nausea and vomiting)   . Dysrhythmia     ac/jc  . GERD (gastroesophageal reflux disease)   . Joint pain   . Hemorrhoids   . BPH (benign prostatic hyperplasia)   . Chronic kidney disease   . Kidney stones    Past Surgical History:  Past Surgical History  Procedure Date  . Back surgery     2004 nd 2012  . Cervical fusion 2010  . Sinus sugery   . Lithotripsy   . Tonsillectomy     OT Assessment/Plan/Recommendation OT Assessment Clinical Impression Statement: Pt admitted for Right Lumbar Four-Five Diskectomy and presents with below problem list. Pt will benefit from skilled OT in the acute setting to increase I with ADL and ADL mobility to Mod I-I level upon d/c home.  OT Recommendation/Assessment: Patient will need skilled OT in the acute care venue OT Problem List: Decreased strength;Decreased activity tolerance;Decreased knowledge of use of DME or AE;Decreased knowledge of precautions;Pain OT Therapy Diagnosis : Generalized weakness;Acute pain OT Plan OT Frequency: Min 2X/week OT Treatment/Interventions: Self-care/ADL training;Therapeutic exercise;DME and/or AE instruction;Patient/family education;Therapeutic activities OT Recommendation Follow Up Recommendations: No OT follow up (after hospital d/c) Equipment Recommended: 3 in 1 bedside comode Individuals Consulted Consulted and Agree with Results and Recommendations: Patient;Family member/caregiver Family Member Consulted: wife OT Goals Acute Rehab OT Goals OT Goal Formulation: With patient/family Time For Goal Achievement: 7 days ADL Goals Pt Will Perform Lower Body Bathing: with modified independence;Sitting,  chair ADL Goal: Lower Body Bathing - Progress: Goal set today Pt Will Perform Lower Body Dressing: with modified independence;Sit to stand from chair;with adaptive equipment ADL Goal: Lower Body Dressing - Progress: Goal set today Pt Will Transfer to Toilet: with modified independence;Ambulation;3-in-1 ADL Goal: Toilet Transfer - Progress: Goal set today Pt Will Perform Toileting - Clothing Manipulation: with modified independence;Standing ADL Goal: Toileting - Clothing Manipulation - Progress: Goal set today Pt Will Perform Toileting - Hygiene: Independently;Sit to stand from 3-in-1/toilet;Sitting on 3-in-1 or toilet ADL Goal: Toileting - Hygiene - Progress: Goal set today  OT Evaluation Precautions/Restrictions  Precautions Precautions: Back Precaution Comments: Educated patient on back precautions  Required Braces or Orthoses: No Prior Functioning Home Living Lives With: Spouse Receives Help From: Family Type of Home: House Home Layout: Two level;Able to live on main level with bedroom/bathroom Alternate Level Stairs-Rails: Right Alternate Level Stairs-Number of Steps: 12 Home Access: Stairs to enter Entrance Stairs-Rails: Left Entrance Stairs-Number of Steps: 3 Bathroom Shower/Tub: Walk-in shower;Door [employer] Toilet: Standard Bathroom Accessibility: Yes How Accessible: Accessible via walker Home Adaptive Equipment: Built-in shower seat;None Prior Function Level of Independence: Independent with basic ADLs;Independent with gait;Independent with transfers Able to Take Stairs?: Yes Driving: Yes Vocation: Full time employment Leisure: Hobbies-yes (Comment) Comments: golf ADL ADL Eating/Feeding: Simulated;Independent Where Assessed - Eating/Feeding: Chair Grooming: Performed;Wash/dry hands;Minimal assistance Grooming Details (indicate cue type and reason): A to maintain balance at sink- pt with Rt hip buckling Where Assessed - Grooming: Standing at sink Upper Body  Bathing: Simulated;Set up;Supervision/safety Where Assessed - Upper Body Bathing: Sitting, chair Lower Body Bathing: Simulated;Moderate assistance Where Assessed - Lower Body Bathing: Sit to stand from chair Upper Body Dressing: Simulated;Set up;Supervision/safety Where Assessed - Upper Body Dressing: Sitting, chair Lower  Body Dressing: Performed;Minimal assistance Lower Body Dressing Details (indicate cue type and reason): don pajama pants; A required to thread Rt foot Where Assessed - Lower Body Dressing: Sit to stand from chair Toilet Transfer: Performed;+2 Total assistance (pt=70%) Toilet Transfer Details (indicate cue type and reason): see Amb for details Toilet Transfer Method: Ambulating Toilet Transfer Equipment: Regular height toilet;Grab bars Toileting - Clothing Manipulation: Performed;Minimal assistance Toileting - Clothing Manipulation Details (indicate cue type and reason): to move gown out of the way Where Assessed - Glass blower/designer Manipulation: Standing Toileting - Hygiene: Not assessed Tub/Shower Transfer: Not assessed Equipment Used: Rolling walker Ambulation Related to ADLs: +2total A(pt=70%) for RW ambulation. Pt with Rt hip buckling x3 with ambulation- able to catch self with UEs on RW and with therapist support.  ADL Comments: Will beneift from AE instruction Vision/Perception  Vision - History Patient Visual Report: No change from baseline Cognition Cognition Orientation Level: Oriented X4 Sensation/Coordination Sensation Light Touch: Appears Intact (for UE's) Coordination Gross Motor Movements are Fluid and Coordinated: Yes (for UE's) Fine Motor Movements are Fluid and Coordinated: Yes (for UE's) Extremity Assessment RUE Assessment RUE Assessment: Within Functional Limits LUE Assessment LUE Assessment: Within Functional Limits Mobility  Bed Mobility Rolling Left: 5: Supervision;With rail Rolling Left Details (indicate cue type and reason): VC for  technique Left Sidelying to Sit: 5: Supervision;HOB flat;With rails Left Sidelying to Sit Details (indicate cue type and reason): VC for technique Exercises Educated pt on chair push-ups to increase tricep strength for RW ambulation as pt is relying heavily on UE's secondary to Rt. Hip buckling. May benefit from theraband exercises. End of Session OT - End of Session Equipment Utilized During Treatment: Gait belt Activity Tolerance: Patient limited by pain Patient left: in chair;with call bell in reach;with family/visitor present Nurse Communication: Mobility status for ambulation;Mobility status for transfers General Behavior During Session: [facility] for tasks performed Cognition: [facility] for tasks performed   Jaycub Noorani 08/09/2011, 11:14 AM

## 2011-08-10 MED ORDER — HYDROXYZINE HCL 50 MG PO TABS
50.0000 mg | ORAL_TABLET | ORAL | Status: DC | PRN
  Administered 2011-08-10: 50 mg via ORAL
  Filled 2011-08-10: qty 1

## 2011-08-10 MED ORDER — HYDROXYZINE HCL 50 MG/ML IM SOLN
50.0000 mg | INTRAMUSCULAR | Status: DC | PRN
  Filled 2011-08-10: qty 1

## 2011-08-10 NOTE — Progress Notes (Signed)
Physical Therapy Treatment Patient Details Name: [name] [name] MRN: [id number] DOB: 1971-01-20 Today's Date: 08/10/2011  PT Assessment/Plan  PT - Assessment/Plan Comments on Treatment Session: Pt progressing well this session. He is now able to control his R hip without physical assist. Continue RLE strengthening and fucntional mobility PT Plan: Discharge plan remains appropriate;Frequency remains appropriate PT Frequency: Min 5X/week Follow Up Recommendations: Home health PT;Supervision/Assistance - 24 hour Equipment Recommended: Rolling walker with 5" wheels;3 in 1 bedside comode PT Goals  Acute Rehab PT Goals PT Goal Formulation: With patient PT Goal: Sit to Stand - Progress: Progressing toward goal PT Goal: Stand to Sit - Progress: Progressing toward goal PT Transfer Goal: Bed to Chair/Chair to Bed - Progress: Progressing toward goal PT Goal: Ambulate - Progress: Met PT Goal: Up/Down Stairs - Progress: Progressing toward goal  PT Treatment Precautions/Restrictions  Precautions Precautions: Back Precaution Comments: Pt able to verbalize and demonstrate all back precautions Required Braces or Orthoses: No Restrictions Weight Bearing Restrictions: No Mobility (including Balance) Bed Mobility Bed Mobility: No Transfers Transfers: Yes Sit to Stand: 5: Supervision;With upper extremity assist;From bed Sit to Stand Details (indicate cue type and reason): VC for hand placement  Stand to Sit: 5: Supervision;With upper extremity assist;To chair/3-in-1 Stand to Sit Details: VC for hand placement Ambulation/Gait Ambulation/Gait: Yes Ambulation/Gait Assistance: 5: Supervision Ambulation/Gait Assistance Details (indicate cue type and reason): Pt improving his gait, and is able to control the right hip throughout ambulation with contraction. When pt does not contract the right hip, it still has a slight buckle but he is able to control it with UE strength to prevent falls. VC for proper  gait sequencing Ambulation Distance (Feet): 200 Feet Assistive device: Rolling walker Gait Pattern: Decreased hip/knee flexion - right;Decreased stance time - right;Step-to pattern;Trunk flexed;Decreased weight shift to right Gait velocity: Decreased cadence Stairs: Yes Stairs Assistance: 4: Min assist Stairs Assistance Details (indicate cue type and reason): VC for proper stair sequencing. Pt completed with min assist of hand held assist Stair Management Technique: One rail Left;Forwards;Other (comment) (hand hand assist right) Number of Stairs: 2     Exercise  General Exercises - Lower Extremity Quad Sets: AROM;Strengthening;Right;10 reps;Supine Gluteal Sets: AROM;Strengthening;Both;10 reps;Seated Long Arc Quad: AROM;Strengthening;Right;10 reps Straight Leg Raises: AROM;Strengthening;5 reps;Supine;Right Hip Flexion/Marching: AROM;Strengthening;Right;10 reps;Seated End of Session PT - End of Session Equipment Utilized During Treatment: Gait belt Activity Tolerance: Patient limited by fatigue;Patient limited by pain Patient left: in chair;with call bell in reach;with family/visitor present Nurse Communication: Mobility status for transfers;Mobility status for ambulation General Behavior During Session: [facility] for tasks performed Cognition: [facility] for tasks performed  Milana Kidney 08/10/2011, 1:04 PM  08/10/2011 Milana Kidney DPT PAGER: [phone] OFFICE: [phone]

## 2011-08-10 NOTE — Progress Notes (Signed)
Subjective: Patient reports Right leg feels weak buckle  at the knee. Back feels better  Objective: Vital signs in last 24 hours: Temp:  [98.2 F (36.8 C)-98.8 F (37.1 C)] 98.2 F (36.8 C) (02/03 0509) Pulse Rate:  [72-94] 72  (02/03 0509) Resp:  [18] 18  (02/03 0509) BP: (113-125)/(68-76) 124/74 mmHg (02/03 0509) SpO2:  [95 %-97 %] 97 % (02/03 0509)  Intake/Output from previous day: 02/02 0701 - 02/03 0700 In: 600 [P.O.:600] Out: -  Intake/Output this shift: Total I/O In: 360 [P.O.:360] Out: -   Physical exam: Right lower extremity quadriceps and iliopsoas weakness 3/5  Lab Results:  Basename 08/08/11 1045  WBC 7.2  HGB 15.8  HCT 43.9  PLT 162   BMET  Basename 08/08/11 1045  NA 143  K 4.1  CL 105  CO2 28  GLUCOSE 90  BUN 13  CREATININE 1.00  CALCIUM 9.8    Studies/Results: Dg Chest 2 View  08/08/2011  *RADIOLOGY REPORT*  Clinical Data: Preop  CHEST - 2 VIEW  Comparison: 02/14/2009  Findings: Car mediastinal silhouette is within normal limits. Lungs are clear.  No pneumothorax and no pleural effusion.  IMPRESSION: No active cardiopulmonary disease.  Original Report Authenticated By: Donavan Burnet, M.D.   Dg Lumbar Spine 1 View  08/08/2011  *RADIOLOGY REPORT*  Clinical Data: L4-5 discectomy.  LUMBAR SPINE - 1 VIEW  Comparison: Post-myelogram CT scan 08/01/2011.  Findings: We are provided with a single intraoperative view of the lumbar spine in the lateral projection.  Images demonstrates localization of the L4-5 level.  IMPRESSION: L4-5 localization.  Original Report Authenticated By: Bernadene Bell. Maricela Curet, M.D.    Assessment/Plan: Right lower extremity weakness status post decompression lumbar spine  LOS: 2 days  Physical therapy occupational therapy to continue working with patient until independence improves and patient can return home safely   Johnel Yielding J 08/10/2011, 9:51 AM

## 2011-08-11 ENCOUNTER — Encounter (HOSPITAL_COMMUNITY): Payer: Self-pay | Admitting: Neurosurgery

## 2011-08-11 MED ORDER — DIAZEPAM 5 MG PO TABS: 5.0000 mg | ORAL_TABLET | Freq: Four times a day (QID) | ORAL | Status: AC | PRN

## 2011-08-11 MED ORDER — MAGIC MOUTHWASH
5.0000 mL | Freq: Four times a day (QID) | ORAL | Status: DC | PRN
  Administered 2011-08-11: 5 mL via ORAL
  Filled 2011-08-11: qty 5

## 2011-08-11 NOTE — Discharge Summary (Signed)
Physician Discharge Summary  Patient ID: [name] [name] MRN: [id number] DOB/AGE: 02-23-1971 41 y.o.  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 *    Discharged Condition: good  Hospital Course: Patient was admitted to undergo surgery for recurrent herniated nucleus pulposus at L4-L5 on the right side. The patient had preoperative weakness in the quadriceps and tibialis anterior on the right side and this has continued during the postoperative period. He is ambulating with the use of a walker and today on the day of discharge his right quadriceps strength seems improved . He is undergoing pain management with Dr. Robley Fries  Consults: None  Significant Diagnostic Studies: None  Treatments: surgery: Decompression at L4-L5 right for recurrent herniated nucleus pulposus with discectomy and operating microscope  Discharge Exam: Blood pressure 109/67, pulse 87, temperature 98 F (36.7 C), temperature source Oral, resp. rate 20, height 5\' 10"  (1.778 m), weight 83.915 kg (185 lb), SpO2 94.00%. Incision is clean and dry staples are intact quadriceps strength 4/5 iliopsoas strength 4/5 tibialis anterior anterior strength 4/5 on the right side.  Disposition:   Discharge Orders    Future Orders Please Complete By Expires   Diet - low sodium heart healthy      Increase activity slowly      Discharge instructions      Comments:   Sit straight walk straight stand straight. Mind your posture. No lifting greater than 10 pounds no bending or stooping. Okay to shower place small gauze over incision to prevent chafing of staples. Walk frequently.   Call MD for:  temperature >100.4      Call MD for:  severe uncontrolled pain      Call MD for:  redness, tenderness, or signs of infection (pain, swelling, redness, odor or green/yellow discharge around incision site)      Apply dressing      Scheduling Instructions:   Apply gauze dressing to back before discharge, give patient several 4 x 4's and tape for dressing changes.     Medication List  As of 08/11/2011  5:54 PM   TAKE these medications         aspirin 81 MG tablet   Take 160 mg by mouth daily.      cyclobenzaprine 10 MG tablet   Commonly known as: FLEXERIL   Take 5 mg by mouth at bedtime as needed. For muscle spasms      diazepam 5 MG tablet   Commonly known as: VALIUM   Take 5 mg by mouth at bedtime.      diazepam 5 MG tablet   Commonly known as: VALIUM   Take 1 tablet (5 mg total) by mouth every 6 (six) hours as needed (Muscle spasm).      hydroxychloroquine 200 MG tablet   Commonly known as: PLAQUENIL   Take 400 mg by mouth daily.      Lutein 20 MG Caps   Take 20 mg by mouth daily.      metoprolol 50 MG tablet   Commonly known as: LOPRESSOR   Take 50 mg by mouth 2 (two) times daily.      omeprazole 20 MG capsule   Commonly known as: PRILOSEC   Take 20 mg by mouth 2 (two) times daily.      oxymorphone  10 MG tablet   Commonly known as: OPANA   Take 10 mg by mouth every 6 (six) hours as needed. For pain      pregabalin 150 MG capsule   Commonly known as: LYRICA   Take 150 mg by mouth 2 (two) times daily.      ZINC MAGNESIUM ASPARTATE PO   Take 1 tablet by mouth daily.           Follow-up Information    Follow up with Karn Cassis, MD. Schedule an appointment as soon as possible for a visit in 1 week. (Call [facility] for appointment to have staples removed)    Contact information:   [address] [place] [address] [phone]          Signed: Stefani Dama 08/11/2011, 5:54 PM

## 2011-08-11 NOTE — Progress Notes (Signed)
Occupational Therapy Treatment Patient Details Name: [name] [name] MRN: [id number] DOB: 1970-08-03 Today's Date: 08/11/2011  OT Assessment/Plan OT Assessment/Plan Comments on Treatment Session: very concerned about maintaining his back precautions so i reviewed those with him, pt. able to name 3/3 and also maintained them with all functional tasks OT Plan: Discharge plan remains appropriate OT Frequency: Min 2X/week Follow Up Recommendations: No OT follow up Equipment Recommended: Rolling walker with 5" wheels;3 in 1 bedside comode OT Goals Acute Rehab OT Goals Time For Goal Achievement: 7 days ADL Goals ADL Goal: Lower Body Bathing - Progress: Progressing toward goals ADL Goal: Lower Body Dressing - Progress: Progressing toward goals ADL Goal: Toilet Transfer - Progress: Progressing toward goals ADL Goal: Toileting - Clothing Manipulation - Progress: Progressing toward goals ADL Goal: Toileting - Hygiene - Progress: Progressing toward goals  OT Treatment Precautions/Restrictions  Precautions Precautions: Back Precaution Comments: Pt does well with adhering to back precautions with functional activity.   Required Braces or Orthoses: No Restrictions Weight Bearing Restrictions: No   ADL ADL Grooming: Performed;Wash/dry hands;Supervision/safety Grooming Details (indicate cue type and reason): reviewed tech. for organization of needed items to maintain back precautions during functional tasks Where Assessed - Grooming: Standing at sink Lower Body Bathing: Simulated;Other (comment) (reviewed use of LH sponge) Lower Body Dressing: Performed;Supervision/safety;Other (comment) (used sock-aide and reacher) Lower Body Dressing Details (indicate cue type and reason): donned/doffed socks Where Assessed - Lower Body Dressing: Sitting, chair Toilet Transfer: Performed;Supervision/safety Toilet Transfer Method: Proofreader: Regular height toilet Toileting -  Clothing Manipulation: Performed;Supervision/safety Where Assessed - Glass blower/designer Manipulation: Standing Toileting - Hygiene: Performed;Supervision/safety Toileting - Hygiene Details (indicate cue type and reason): pt. able to perform front peri-care with not issues, did review tech. and options for back peri-care/toilet-hygiene Where Assessed - Toileting Hygiene: Standing Ambulation Related to ADLs: supervision, with RW to amb. to/from b.room ADL Comments: provided demo of A/E, and allowed pt. to practice and return demonstration. pt. able to and wife planned on purchasing a/e kit today Mobility  Bed Mobility Bed Mobility: No Rolling Right: 6: Modified independent (Device/Increase time) Rolling Left: 6: Modified independent (Device/Increase time) Left Sidelying to Sit: 6: Modified independent (Device/Increase time);HOB flat Transfers Transfers: Yes Sit to Stand: 5: Supervision;From elevated surface;From bed Sit to Stand Details (indicate cue type and reason): pt. has very hight bed that he uses a step stool to get in/out of, used step from therapy gym to simulate and had height of bed elevated to confirmed height. pt. able to back up to step and step onto it with s, and ease himself into bed while maintaining back precautions and no safety issues noted both in/out of bed with step Stand to Sit: 5: Supervision;To elevated surface;To bed Stand to Sit Details: Cues for hand placement.  Exercises General Exercises - Lower Extremity Long Arc Quad: AROM;Strengthening;Right;10 reps;Seated Hip Flexion/Marching: AROM;Strengthening;Right;10 reps;Seated Heel Raises: AROM;Strengthening;Both;10 reps;Seated  End of Session OT - End of Session Activity Tolerance: Patient tolerated treatment well Patient left: in chair;with call bell in reach;with family/visitor present General Behavior During Session: [facility] for tasks performed Cognition: [facility] for tasks performed  Robet Leu  COTA/L 08/11/2011, 1:04 PM

## 2011-08-11 NOTE — Progress Notes (Signed)
Physical Therapy Treatment Patient Details Name: [name] [name] MRN: [id number] DOB: 09/08/1970 Today's Date: 08/11/2011  PT Assessment/Plan  PT - Assessment/Plan Comments on Treatment Session: Pt states he is experiencing increased Rt. knee buckling today than last PT session but was still able to prevent falls with his UE's supported on RW.   PT Plan: Discharge plan remains appropriate PT Frequency: Min 5X/week Follow Up Recommendations: Home health PT;Supervision/Assistance - 24 hour Equipment Recommended: Rolling walker with 5" wheels;3 in 1 bedside comode PT Goals  Acute Rehab PT Goals PT Goal: Sit to Stand - Progress: Progressing toward goal PT Goal: Stand to Sit - Progress: Progressing toward goal PT Goal: Ambulate - Progress: Not met PT Goal: Up/Down Stairs - Progress: Met  PT Treatment Precautions/Restrictions  Precautions Precautions: Back Precaution Comments: Pt does well with adhering to back precautions with functional activity.   Required Braces or Orthoses: No Restrictions Weight Bearing Restrictions: No Mobility (including Balance) Bed Mobility Rolling Right: 6: Modified independent (Device/Increase time) Rolling Left: 6: Modified independent (Device/Increase time) Left Sidelying to Sit: 6: Modified independent (Device/Increase time);HOB flat Transfers Sit to Stand: 5: Supervision;From bed;Other (comment) (mat table. ) Sit to Stand Details (indicate cue type and reason): Cues for hand placement.  Stand to Sit: 5: Supervision;To chair/3-in-1;Other (comment) (mat table. ) Stand to Sit Details: Cues for hand placement.  Ambulation/Gait Ambulation/Gait Assistance: 5: Supervision;Other (comment) (Min Guard (A)) Ambulation/Gait Assistance Details (indicate cue type and reason): Supervision but then progressed to [facility] as distance increased due to pt with increased Rt. knee buckling but able to prevent falls with UE support on RW.  Education on inreased weakness as he  becomes fatigued with increased activity.  Pt states he was experiencing buckling yesterday but not as severe as today.   Ambulation Distance (Feet): 200 Feet Assistive device: Rolling walker Gait Pattern: Step-through pattern;Decreased hip/knee flexion - right;Decreased weight shift to right;Decreased stance time - right Stairs Assistance: Other (comment) (Min Guard (A)) Stairs Assistance Details (indicate cue type and reason): provided HHA but pt did not bear any weight/require any assistance for support.  Pt able to recall & return demonstration of proper sequencing Stair Management Technique: One rail Left;Step to pattern;Forwards Number of Stairs: 2  Corporate treasurer: No    Exercise  General Exercises - Lower Extremity Long Arc Quad: AROM;Strengthening;Right;10 reps;Seated Hip Flexion/Marching: AROM;Strengthening;Right;10 reps;Seated Heel Raises: AROM;Strengthening;Both;10 reps;Seated End of Session PT - End of Session Equipment Utilized During Treatment: Gait belt Activity Tolerance: Patient limited by pain;Patient limited by fatigue Patient left: in chair;with call bell in reach;with family/visitor present General Behavior During Session: [facility] for tasks performed Cognition: [facility] for tasks performed  Lara Mulch 08/11/2011, 12:07 PM [phone]

## 2011-08-11 NOTE — Progress Notes (Signed)
08/11/11 1200 Case Manager spoke with patient and wife. They requested [facility] for HH. Ordered rolling walker, hesays he doesn't need a 3in1. Entered in TLC.

## 2011-12-30 ENCOUNTER — Ambulatory Visit: Payer: 59 | Admitting: Physical Therapy

## 2012-06-25 ENCOUNTER — Other Ambulatory Visit: Payer: Self-pay | Admitting: Anesthesiology

## 2012-06-25 DIAGNOSIS — M545 Low back pain, unspecified: Secondary | ICD-10-CM

## 2012-06-28 ENCOUNTER — Ambulatory Visit
Admission: RE | Admit: 2012-06-28 | Discharge: 2012-06-28 | Disposition: A | Discharge status: Home / Self Care | Payer: 59 | Source: Ambulatory Visit | Attending: Anesthesiology | Admitting: Anesthesiology

## 2012-06-28 DIAGNOSIS — M545 Low back pain, unspecified: Secondary | ICD-10-CM

## 2012-06-29 ENCOUNTER — Ambulatory Visit
Admission: RE | Admit: 2012-06-29 | Discharge: 2012-06-29 | Disposition: A | Discharge status: Home / Self Care | Payer: 59 | Source: Ambulatory Visit | Attending: Anesthesiology | Admitting: Anesthesiology

## 2012-06-29 VITALS — BP 128/74 | HR 72

## 2012-06-29 DIAGNOSIS — M545 Low back pain, unspecified: Secondary | ICD-10-CM

## 2012-06-29 MED ORDER — DIAZEPAM 5 MG PO TABS
10.0000 mg | ORAL_TABLET | Freq: Once | ORAL | Status: AC
  Administered 2012-06-29: 10 mg via ORAL

## 2012-06-29 MED ORDER — ONDANSETRON HCL 4 MG/2ML IJ SOLN: 4.0000 mg | Freq: Four times a day (QID) | INTRAMUSCULAR | Status: DC | PRN

## 2012-06-29 MED ORDER — HYDROMORPHONE HCL PF 2 MG/ML IJ SOLN
2.0000 mg | Freq: Once | INTRAMUSCULAR | Status: AC
  Administered 2012-06-29: 2 mg via INTRAMUSCULAR

## 2012-06-29 MED ORDER — HYDROMORPHONE HCL PF 2 MG/ML IJ SOLN
1.5000 mg | Freq: Once | INTRAMUSCULAR | Status: AC
  Administered 2012-06-29: 1.5 mg via INTRAMUSCULAR

## 2012-06-29 MED ORDER — IOHEXOL 180 MG/ML  SOLN
17.0000 mL | Freq: Once | INTRAMUSCULAR | Status: AC | PRN
  Administered 2012-06-29: 17 mL via INTRATHECAL

## 2012-06-29 MED ORDER — ONDANSETRON HCL 4 MG/2ML IJ SOLN
4.0000 mg | Freq: Once | INTRAMUSCULAR | Status: AC
  Administered 2012-06-29: 4 mg via INTRAMUSCULAR

## 2012-06-29 NOTE — Progress Notes (Addendum)
Since the pain shot (Dilaudid), "I feel the best right now that I have in about six months."

## 2012-07-02 ENCOUNTER — Other Ambulatory Visit: Payer: Self-pay | Admitting: Anesthesiology

## 2012-07-02 ENCOUNTER — Ambulatory Visit
Admission: RE | Admit: 2012-07-02 | Discharge: 2012-07-02 | Disposition: A | Discharge status: Home / Self Care | Payer: 59 | Source: Ambulatory Visit | Attending: Anesthesiology | Admitting: Anesthesiology

## 2012-07-02 DIAGNOSIS — M545 Low back pain, unspecified: Secondary | ICD-10-CM

## 2012-07-02 DIAGNOSIS — G971 Other reaction to spinal and lumbar puncture: Secondary | ICD-10-CM

## 2012-07-02 MED ORDER — IOHEXOL 180 MG/ML  SOLN
1.0000 mL | Freq: Once | INTRAMUSCULAR | Status: AC | PRN
  Administered 2012-07-02 (×2): 1 mL via INTRAVENOUS

## 2012-07-02 NOTE — Progress Notes (Signed)
Blood drawn for blood patch from left AC. 20 cc obtained, site unremarkable and pt tolerated procedure well.

## 2012-07-07 HISTORY — PX: SPINAL CORD STIMULATOR IMPLANT: SHX2422

## 2012-08-21 ENCOUNTER — Other Ambulatory Visit: Payer: Self-pay

## 2013-05-12 ENCOUNTER — Other Ambulatory Visit: Payer: Self-pay

## 2014-04-19 ENCOUNTER — Ambulatory Visit
Admission: RE | Admit: 2014-04-19 | Discharge: 2014-04-19 | Disposition: A | Discharge status: Home / Self Care | Payer: 59 | Source: Ambulatory Visit | Attending: Anesthesiology | Admitting: Anesthesiology

## 2014-04-19 ENCOUNTER — Other Ambulatory Visit: Payer: Self-pay | Admitting: Anesthesiology

## 2014-04-19 VITALS — BP 122/73 | HR 77

## 2014-04-19 DIAGNOSIS — M545 Low back pain, unspecified: Secondary | ICD-10-CM

## 2014-04-19 DIAGNOSIS — G8929 Other chronic pain: Secondary | ICD-10-CM

## 2014-04-19 MED ORDER — IOHEXOL 180 MG/ML  SOLN
17.0000 mL | Freq: Once | INTRAMUSCULAR | Status: AC | PRN
  Administered 2014-04-19: 17 mL via INTRATHECAL

## 2014-04-19 MED ORDER — DIAZEPAM 5 MG PO TABS
10.0000 mg | ORAL_TABLET | Freq: Once | ORAL | Status: AC
  Administered 2014-04-19: 10 mg via ORAL

## 2014-04-19 NOTE — Discharge Instructions (Signed)

## 2016-05-01 ENCOUNTER — Other Ambulatory Visit: Payer: Self-pay | Admitting: Neurosurgery

## 2016-05-01 DIAGNOSIS — M5416 Radiculopathy, lumbar region: Secondary | ICD-10-CM

## 2016-05-13 ENCOUNTER — Ambulatory Visit
Admission: RE | Admit: 2016-05-13 | Discharge: 2016-05-13 | Disposition: A | Discharge status: Home / Self Care | Payer: Medicare Other | Source: Ambulatory Visit | Attending: Neurosurgery | Admitting: Neurosurgery

## 2016-05-13 DIAGNOSIS — M5416 Radiculopathy, lumbar region: Secondary | ICD-10-CM

## 2016-05-13 MED ORDER — DIAZEPAM 5 MG PO TABS
5.0000 mg | ORAL_TABLET | Freq: Once | ORAL | Status: AC
  Administered 2016-05-13: 5 mg via ORAL

## 2016-05-13 MED ORDER — HYDROXYZINE HCL 50 MG/ML IM SOLN
25.0000 mg | Freq: Once | INTRAMUSCULAR | Status: AC
  Administered 2016-05-13: 25 mg via INTRAMUSCULAR

## 2016-05-13 MED ORDER — MEPERIDINE HCL 100 MG/ML IJ SOLN
75.0000 mg | Freq: Once | INTRAMUSCULAR | Status: AC
  Administered 2016-05-13: 75 mg via INTRAMUSCULAR

## 2016-05-13 MED ORDER — ONDANSETRON HCL 4 MG/2ML IJ SOLN: 4.0000 mg | Freq: Four times a day (QID) | INTRAMUSCULAR | Status: DC | PRN

## 2016-05-13 MED ORDER — IOPAMIDOL (ISOVUE-M 300) INJECTION 61%
10.0000 mL | Freq: Once | INTRAMUSCULAR | Status: AC | PRN
  Administered 2016-05-13: 10 mL via INTRATHECAL

## 2016-05-13 NOTE — Discharge Instructions (Signed)

## 2019-09-09 ENCOUNTER — Telehealth: Payer: Self-pay

## 2019-09-09 ENCOUNTER — Other Ambulatory Visit: Payer: Self-pay | Admitting: Neurosurgery

## 2019-09-09 DIAGNOSIS — M542 Cervicalgia: Secondary | ICD-10-CM

## 2019-09-09 NOTE — Telephone Encounter (Signed)
Phone call to patient to verify medication list and allergies for myelogram procedure. All medications pt is currently on is safe for him to continue for the myelogram procedure. Pt instructed that he would be with [place] around 2 hours the day of, he would need a driver to drive him home after procedure and that he would need to lay flat for at least 24 hours post procedure. Pt verbalized understanding and also has our contact information if he were to have any questions.

## 2019-09-14 ENCOUNTER — Other Ambulatory Visit: Payer: Self-pay | Admitting: Neurosurgery

## 2019-09-14 DIAGNOSIS — M542 Cervicalgia: Secondary | ICD-10-CM

## 2019-09-14 DIAGNOSIS — M961 Postlaminectomy syndrome, not elsewhere classified: Secondary | ICD-10-CM

## 2019-09-15 ENCOUNTER — Ambulatory Visit
Admission: RE | Admit: 2019-09-15 | Discharge: 2019-09-15 | Disposition: A | Discharge status: Home / Self Care | Payer: Medicare Other | Source: Ambulatory Visit | Attending: Neurosurgery | Admitting: Neurosurgery

## 2019-09-15 ENCOUNTER — Other Ambulatory Visit: Payer: Self-pay

## 2019-09-15 DIAGNOSIS — M961 Postlaminectomy syndrome, not elsewhere classified: Secondary | ICD-10-CM

## 2019-09-15 DIAGNOSIS — M542 Cervicalgia: Secondary | ICD-10-CM

## 2019-09-15 MED ORDER — MEPERIDINE HCL 100 MG/ML IJ SOLN
25.0000 mg | Freq: Once | INTRAMUSCULAR | Status: AC
  Administered 2019-09-15: 25 mg via INTRAMUSCULAR

## 2019-09-15 MED ORDER — MEPERIDINE HCL 100 MG/ML IJ SOLN
75.0000 mg | Freq: Once | INTRAMUSCULAR | Status: AC
  Administered 2019-09-15: 75 mg via INTRAMUSCULAR

## 2019-09-15 MED ORDER — ONDANSETRON HCL 4 MG/2ML IJ SOLN
4.0000 mg | Freq: Once | INTRAMUSCULAR | Status: AC
  Administered 2019-09-15: 4 mg via INTRAMUSCULAR

## 2019-09-15 MED ORDER — DIAZEPAM 5 MG PO TABS
10.0000 mg | ORAL_TABLET | Freq: Once | ORAL | Status: AC
  Administered 2019-09-15: 5 mg via ORAL

## 2019-09-15 MED ORDER — ONDANSETRON HCL 4 MG/2ML IJ SOLN: 4.0000 mg | Freq: Four times a day (QID) | INTRAMUSCULAR | Status: DC | PRN

## 2019-09-15 MED ORDER — IOPAMIDOL (ISOVUE-M 300) INJECTION 61%
10.0000 mL | Freq: Once | INTRAMUSCULAR | Status: AC
  Administered 2019-09-15: 10 mL via INTRATHECAL

## 2019-09-15 NOTE — Discharge Instructions (Signed)

## 2020-02-23 ENCOUNTER — Telehealth: Payer: Self-pay | Admitting: Nurse Practitioner

## 2020-02-23 ENCOUNTER — Other Ambulatory Visit: Payer: Self-pay | Admitting: Physician Assistant

## 2020-02-23 DIAGNOSIS — M546 Pain in thoracic spine: Secondary | ICD-10-CM

## 2020-02-23 DIAGNOSIS — M542 Cervicalgia: Secondary | ICD-10-CM

## 2020-02-23 NOTE — Telephone Encounter (Signed)
Phone call to patient to verify medication list and allergies for myelogram procedure. Pt aware he will not need to hold any medications for this procedure. Pre and post procedure instructions reviewed with pt. Pt verbalized understanding.  

## 2020-02-29 ENCOUNTER — Other Ambulatory Visit: Payer: Self-pay

## 2020-02-29 ENCOUNTER — Ambulatory Visit
Admission: RE | Admit: 2020-02-29 | Discharge: 2020-02-29 | Disposition: A | Discharge status: Home / Self Care | Payer: Medicare Other | Source: Ambulatory Visit | Attending: Physician Assistant | Admitting: Physician Assistant

## 2020-02-29 DIAGNOSIS — M546 Pain in thoracic spine: Secondary | ICD-10-CM

## 2020-02-29 DIAGNOSIS — M542 Cervicalgia: Secondary | ICD-10-CM

## 2020-02-29 MED ORDER — MEPERIDINE HCL 100 MG/ML IJ SOLN
50.0000 mg | Freq: Once | INTRAMUSCULAR | Status: AC
  Administered 2020-02-29: 50 mg via INTRAMUSCULAR

## 2020-02-29 MED ORDER — MEPERIDINE HCL 100 MG/ML IJ SOLN
25.0000 mg | Freq: Once | INTRAMUSCULAR | Status: AC
  Administered 2020-02-29: 25 mg via INTRAMUSCULAR

## 2020-02-29 MED ORDER — ONDANSETRON HCL 4 MG/2ML IJ SOLN
4.0000 mg | Freq: Once | INTRAMUSCULAR | Status: AC
  Administered 2020-02-29: 4 mg via INTRAMUSCULAR

## 2020-02-29 MED ORDER — DIAZEPAM 5 MG PO TABS
10.0000 mg | ORAL_TABLET | Freq: Once | ORAL | Status: AC
  Administered 2020-02-29: 5 mg via ORAL

## 2020-02-29 MED ORDER — IOPAMIDOL (ISOVUE-M 300) INJECTION 61%
10.0000 mL | Freq: Once | INTRAMUSCULAR | Status: AC | PRN
  Administered 2020-02-29: 10 mL via INTRATHECAL

## 2020-02-29 MED ORDER — MEPERIDINE HCL 25 MG/ML IJ SOLN: 25.0000 mg | Freq: Once | INTRAMUSCULAR | Status: DC

## 2020-02-29 NOTE — Discharge Instructions (Signed)

## 2020-03-01 ENCOUNTER — Other Ambulatory Visit: Payer: Self-pay | Admitting: Physician Assistant

## 2021-11-12 ENCOUNTER — Other Ambulatory Visit: Payer: Self-pay | Admitting: Neurological Surgery

## 2021-11-12 ENCOUNTER — Other Ambulatory Visit (HOSPITAL_COMMUNITY): Payer: Self-pay | Admitting: Neurological Surgery

## 2021-11-12 DIAGNOSIS — M961 Postlaminectomy syndrome, not elsewhere classified: Secondary | ICD-10-CM

## 2021-12-04 ENCOUNTER — Ambulatory Visit (HOSPITAL_COMMUNITY)
Admission: RE | Admit: 2021-12-04 | Discharge: 2021-12-04 | Disposition: A | Discharge status: Home / Self Care | Payer: Medicare Other | Source: Ambulatory Visit | Attending: Neurological Surgery | Admitting: Neurological Surgery

## 2021-12-04 ENCOUNTER — Other Ambulatory Visit: Payer: Self-pay

## 2021-12-04 DIAGNOSIS — M25551 Pain in right hip: Secondary | ICD-10-CM | POA: Diagnosis not present

## 2021-12-04 DIAGNOSIS — M79605 Pain in left leg: Secondary | ICD-10-CM | POA: Diagnosis not present

## 2021-12-04 DIAGNOSIS — M961 Postlaminectomy syndrome, not elsewhere classified: Secondary | ICD-10-CM

## 2021-12-04 DIAGNOSIS — M79604 Pain in right leg: Secondary | ICD-10-CM | POA: Diagnosis not present

## 2021-12-04 DIAGNOSIS — M5137 Other intervertebral disc degeneration, lumbosacral region: Secondary | ICD-10-CM | POA: Insufficient documentation

## 2021-12-04 DIAGNOSIS — M48061 Spinal stenosis, lumbar region without neurogenic claudication: Secondary | ICD-10-CM | POA: Diagnosis not present

## 2021-12-04 DIAGNOSIS — M5416 Radiculopathy, lumbar region: Secondary | ICD-10-CM | POA: Diagnosis not present

## 2021-12-04 DIAGNOSIS — M2578 Osteophyte, vertebrae: Secondary | ICD-10-CM | POA: Insufficient documentation

## 2021-12-04 DIAGNOSIS — M5136 Other intervertebral disc degeneration, lumbar region: Secondary | ICD-10-CM | POA: Diagnosis not present

## 2021-12-04 DIAGNOSIS — M25552 Pain in left hip: Secondary | ICD-10-CM | POA: Insufficient documentation

## 2021-12-04 DIAGNOSIS — Z9889 Other specified postprocedural states: Secondary | ICD-10-CM | POA: Insufficient documentation

## 2021-12-04 DIAGNOSIS — M5126 Other intervertebral disc displacement, lumbar region: Secondary | ICD-10-CM | POA: Diagnosis not present

## 2021-12-04 MED ORDER — IOHEXOL 180 MG/ML  SOLN
20.0000 mL | Freq: Once | INTRAMUSCULAR | Status: AC | PRN
  Administered 2021-12-04: 12 mL via INTRATHECAL

## 2021-12-04 MED ORDER — LIDOCAINE HCL (PF) 1 % IJ SOLN
5.0000 mL | Freq: Once | INTRAMUSCULAR | Status: AC
  Administered 2021-12-04: 3 mL via INTRADERMAL

## 2021-12-04 MED ORDER — HYDROCODONE-ACETAMINOPHEN 5-325 MG PO TABS
1.0000 | ORAL_TABLET | ORAL | Status: DC | PRN
  Administered 2021-12-04: 1 via ORAL
  Filled 2021-12-04: qty 1

## 2021-12-04 MED ORDER — ONDANSETRON HCL 4 MG/2ML IJ SOLN: 4.0000 mg | Freq: Four times a day (QID) | INTRAMUSCULAR | Status: DC | PRN

## 2021-12-04 MED ORDER — DIAZEPAM 5 MG PO TABS
ORAL_TABLET | ORAL | Status: AC
  Administered 2021-12-04: 10 mg via ORAL
  Filled 2021-12-04: qty 2

## 2021-12-04 MED ORDER — DIAZEPAM 5 MG PO TABS
10.0000 mg | ORAL_TABLET | Freq: Once | ORAL | Status: AC
  Filled 2021-12-04: qty 2

## 2021-12-04 NOTE — Procedures (Signed)
Brookes Craine is a 51 year old individual who has had significant back and lower extremity pain.  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  Discussed other imaging choices and has had a lumbar myelograms in the past.  Myelogram is now being performed to discern the nature of his back and leg pain.  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  Further information from CT scanning.

## 2021-12-04 NOTE — Progress Notes (Signed)
Patient and son was given discharge instructions. Both verbalized understanding. 

## 2021-12-06 ENCOUNTER — Other Ambulatory Visit: Payer: Self-pay | Admitting: Neurological Surgery

## 2021-12-16 NOTE — Progress Notes (Addendum)
Surgical Instructions    Your procedure is scheduled on Tuesday June 27th.  [facility] at 9:30 A.M., then check in with the Admitting office.  Call this number if you have problems the morning of surgery:  [phone]   If you have any questions prior to your surgery date call [phone]: Open Monday-Friday 8am-4pm    Remember:  Do not eat after midnight the night before your surgery  You may drink clear liquids until 8:30am the morning of your surgery.   Clear liquids allowed are: Water, Non-Citrus Juices (without pulp), Carbonated Beverages, Clear Tea, Black Coffee ONLY (NO MILK, CREAM OR POWDERED CREAMER of any kind), and Gatorade    Take these medicines the morning of surgery with A SIP OF WATER: amLODipine (NORVASC) 5 MG tablet carvedilol (COREG) 25 MG tablet pregabalin (LYRICA) 150 MG capsule   IF NEEDED  HYDROmorphone (DILAUDID) 8 MG tablet    Follow your surgeon's instructions on when to stop Aspirin.  If no instructions were given by your surgeon then you will need to call the office to get those instructions.     As of today, STOP taking any Aspirin (unless otherwise instructed by your surgeon) Aleve, Naproxen, Ibuprofen, Motrin, Advil, Goody's, BC's, all herbal medications, fish oil, and all vitamins.  WHAT DO I DO ABOUT MY DIABETES MEDICATION?   Do not take oral diabetes medicines (Metformin) the morning of surgery.  HOW TO MANAGE YOUR DIABETES BEFORE AND AFTER SURGERY  Why is it important to control my blood sugar before and after surgery? Improving blood sugar levels before and after surgery helps healing and can limit problems. A way of improving blood sugar control is eating a healthy diet by:  Eating less sugar and carbohydrates  Increasing activity/exercise  Talking with your doctor about reaching your blood sugar goals High blood sugars (greater than 180 mg/dL) can raise your risk of infections and slow your recovery, so  you will need to focus on controlling your diabetes during the weeks before surgery. Make sure that the doctor who takes care of your diabetes knows about your planned surgery including the date and location.  How do I manage my blood sugar before surgery? Check your blood sugar at least 4 times a day, starting 2 days before surgery, to make sure that the level is not too high or low.  Check your blood sugar the morning of your surgery when you wake up and every 2 hours until you get to the Short Stay unit.  If your blood sugar is less than 70 mg/dL, you will need to treat for low blood sugar: Do not take insulin. Treat a low blood sugar (less than 70 mg/dL) with  cup of clear juice (cranberry or apple), 4 glucose tablets, OR glucose gel. Recheck blood sugar in 15 minutes after treatment (to make sure it is greater than 70 mg/dL). If your blood sugar is not greater than 70 mg/dL on recheck, call [phone] for further instructions. Report your blood sugar to the short stay nurse when you get to Short Stay.  If you are admitted to the hospital after surgery: Your blood sugar will be checked by the staff and you will probably be given insulin after surgery (instead of oral diabetes medicines) to make sure you have good blood sugar levels. The goal for blood sugar control after surgery is 80-180 mg/dL.        Do not wear jewelry  Do not wear lotions, powders,  colognes, or deodorant. Do not shave 48 hours prior to surgery.  Men may shave face and neck. Do not bring valuables to the hospital. Do not wear nail polish, gel polish, artificial nails, or any other type of covering on natural nails (fingers and toes) If you have artificial nails or gel coating that need to be removed by a nail salon, please have this removed prior to surgery. Artificial nails or gel coating may interfere with anesthesia's ability to adequately monitor your vital signs.  [facility] is not responsible for any  belongings or valuables. .   Do NOT Smoke (Tobacco/Vaping)  24 hours prior to your procedure  If you use a CPAP at night, you may bring your mask for your overnight stay.   Contacts, glasses, hearing aids, dentures or partials may not be worn into surgery, please bring cases for these belongings   For patients admitted to the hospital, discharge time will be determined by your treatment team.   Patients discharged the day of surgery will not be allowed to drive home, and someone needs to stay with them for 24 hours.   SURGICAL WAITING ROOM VISITATION Patients having surgery or a procedure in a hospital may have two support people. Children under the age of 21 must have an adult with them who is not the patient. They may stay in the waiting area during the procedure and may switch out with other visitors. If the patient needs to stay at the hospital during part of their recovery, the visitor guidelines for inpatient rooms apply.  Please refer to the [facility] website for the visitor guidelines for Inpatients (after your surgery is over and you are in a regular room).       Special instructions:    Oral Hygiene is also important to reduce your risk of infection.  Remember - BRUSH YOUR TEETH THE MORNING OF SURGERY WITH YOUR REGULAR TOOTHPASTE   [facility]- Preparing For Surgery  Before surgery, you can play an important role. Because skin is not sterile, your skin needs to be as free of germs as possible. You can reduce the number of germs on your skin by washing with CHG (chlorahexidine gluconate) Soap before surgery.  CHG is an antiseptic cleaner which kills germs and bonds with the skin to continue killing germs even after washing.     Please do not use if you have an allergy to CHG or antibacterial soaps. If your skin becomes reddened/irritated stop using the CHG.  Do not shave (including legs and underarms) for at least 48 hours prior to first CHG shower. It is OK to shave your  face.  Please follow these instructions carefully.     Shower the NIGHT BEFORE SURGERY and the MORNING OF SURGERY with CHG Soap.   If you chose to wash your hair, wash your hair first as usual with your normal shampoo. After you shampoo, rinse your hair and body thoroughly to remove the shampoo.  Then [employer] and genitals (private parts) with your normal soap and rinse thoroughly to remove soap.  After that Use CHG Soap as you would any other liquid soap. You can apply CHG directly to the skin and wash gently with a scrungie or a clean washcloth.   Apply the CHG Soap to your body ONLY FROM THE NECK DOWN.  Do not use on open wounds or open sores. Avoid contact with your eyes, ears, mouth and genitals (private parts). Wash Face and genitals (private parts)  with  your normal soap.   Wash thoroughly, paying special attention to the area where your surgery will be performed.  Thoroughly rinse your body with warm water from the neck down.  DO NOT shower/wash with your normal soap after using and rinsing off the CHG Soap.  Pat yourself dry with a CLEAN TOWEL.  Wear CLEAN PAJAMAS to bed the night before surgery  Place CLEAN SHEETS on your bed the night before your surgery  DO NOT SLEEP WITH PETS.   Day of Surgery:  Take a shower with CHG soap. Wear Clean/Comfortable clothing the morning of surgery Do not apply any deodorants/lotions.   Remember to brush your teeth WITH YOUR REGULAR TOOTHPASTE.    If you received a COVID test during your pre-op visit, it is requested that you wear a mask when out in public, stay away from anyone that may not be feeling well, and notify your surgeon if you develop symptoms. If you have been in contact with anyone that has tested positive in the last 10 days, please notify your surgeon.    Please read over the following fact sheets that you were given.

## 2021-12-17 ENCOUNTER — Other Ambulatory Visit: Payer: Self-pay

## 2021-12-17 ENCOUNTER — Encounter (HOSPITAL_COMMUNITY)
Admission: RE | Admit: 2021-12-17 | Discharge: 2021-12-17 | Disposition: A | Discharge status: Home / Self Care | Payer: Medicare Other | Source: Ambulatory Visit | Attending: Neurological Surgery | Admitting: Neurological Surgery

## 2021-12-17 ENCOUNTER — Encounter (HOSPITAL_COMMUNITY): Payer: Self-pay

## 2021-12-17 VITALS — BP 119/84 | HR 70 | Temp 98.0°F | Resp 17 | Ht 70.0 in | Wt 220.3 lb

## 2021-12-17 DIAGNOSIS — I129 Hypertensive chronic kidney disease with stage 1 through stage 4 chronic kidney disease, or unspecified chronic kidney disease: Secondary | ICD-10-CM | POA: Insufficient documentation

## 2021-12-17 DIAGNOSIS — K760 Fatty (change of) liver, not elsewhere classified: Secondary | ICD-10-CM | POA: Diagnosis not present

## 2021-12-17 DIAGNOSIS — Z7984 Long term (current) use of oral hypoglycemic drugs: Secondary | ICD-10-CM | POA: Insufficient documentation

## 2021-12-17 DIAGNOSIS — Z01812 Encounter for preprocedural laboratory examination: Secondary | ICD-10-CM | POA: Diagnosis present

## 2021-12-17 DIAGNOSIS — M5116 Intervertebral disc disorders with radiculopathy, lumbar region: Secondary | ICD-10-CM | POA: Diagnosis not present

## 2021-12-17 DIAGNOSIS — E1122 Type 2 diabetes mellitus with diabetic chronic kidney disease: Secondary | ICD-10-CM | POA: Diagnosis not present

## 2021-12-17 DIAGNOSIS — N189 Chronic kidney disease, unspecified: Secondary | ICD-10-CM | POA: Diagnosis not present

## 2021-12-17 DIAGNOSIS — Z01818 Encounter for other preprocedural examination: Secondary | ICD-10-CM

## 2021-12-17 DIAGNOSIS — K219 Gastro-esophageal reflux disease without esophagitis: Secondary | ICD-10-CM | POA: Diagnosis not present

## 2021-12-17 DIAGNOSIS — M797 Fibromyalgia: Secondary | ICD-10-CM | POA: Diagnosis not present

## 2021-12-17 HISTORY — DX: Essential (primary) hypertension: I10

## 2021-12-17 HISTORY — DX: Fibromyalgia: M79.7

## 2021-12-17 HISTORY — DX: Unspecified osteoarthritis, unspecified site: M19.90

## 2021-12-17 HISTORY — DX: Type 2 diabetes mellitus without complications: E11.9

## 2021-12-17 HISTORY — DX: Fatty (change of) liver, not elsewhere classified: K76.0

## 2021-12-17 LAB — SURGICAL PCR SCREEN
MRSA, PCR: NEGATIVE
Staphylococcus aureus: POSITIVE — AB

## 2021-12-17 LAB — TYPE AND SCREEN
ABO/RH(D): A POS
Antibody Screen: NEGATIVE

## 2021-12-17 LAB — GLUCOSE, CAPILLARY: Glucose-Capillary: 105 mg/dL — ABNORMAL HIGH (ref 70–99)

## 2021-12-17 NOTE — Progress Notes (Signed)
Due to shellfish allergy, Bactroban called into [facility] for pt to apply nasally for 5 days pre-op for positive MSSA. Pt verbalized understanding of instructions. All concerns addressed at this time.  Viviano Simas, RN

## 2021-12-17 NOTE — Progress Notes (Signed)
PCP - Dr. Elder Negus Cardiologist - Dr. Hoy Finlay  PPM/ICD - n/a  Chest x-ray - n/a EKG - 08/29/21-requested from [facility]  Stress Test - 09/10/21-CE ECHO - 09/05/21-CE Cardiac Cath - denies  Sleep Study - denies CPAP - denies  Fasting Blood Sugar - 120-130 Checks Blood Sugar multiple times a day with Libre 2 to left arm Last A1C-5.8 on 12/07/21-Lab results listed in shadow chart CBG at PAT 105  Blood Thinner Instructions: n/a Aspirin Instructions: Per surgeon, pt is to hold ASA as of 12/24/21.  ERAS Protcol -clear liquids until 0830 DOS. PRE-SURGERY Ensure or G2- none ordered.  COVID TEST- n/a   Anesthesia review: Yes, recent cardiac studies. Pending EKG tracing from cardiologist dates 08/29/21.  Patient denies shortness of breath, fever, cough and chest pain at PAT appointment   All instructions explained to the patient, with a verbal understanding of the material. Patient agrees to go over the instructions while at home for a better understanding. Patient also instructed to self quarantine after being tested for COVID-19. The opportunity to ask questions was provided.

## 2021-12-18 ENCOUNTER — Encounter (HOSPITAL_COMMUNITY): Payer: Self-pay

## 2021-12-18 NOTE — Progress Notes (Signed)
Anesthesia Chart Review:  Case: [id number] Date/Time: 12/31/21 1115   Procedure: L4-5, L5-S1 PLIF (Back) - 3C/RM 21   Anesthesia type: General   Pre-op diagnosis: Lumbar radiculopathy   [facility]: [facility] ROOM 21 / [facility]   Surgeons: Barnett AbuElsner, Henry, MD       DISCUSSION: Patient is a 51 year old male scheduled for the above procedure.  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 Suspected TIA 03/2017. 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  He had a reassuring cardiology work-up in March 2023 which included a stress test, echocardiogram and carotid ultrasound (see CV below).  He denied shortness of breath, cough, chest pain, fever at PAT RN visit.  He brought in copies of 12/06/21 labs fro [facility] including a CBC with differential, complete metabolic panel, lipid panel, hemoglobin A1c, PSA, magnesium.  Results included WBC 6.3, hemoglobin 17.2, hematocrit 51.2, platelet count 199, glucose 101, BUN 13, creatinine 1.18, sodium 143, potassium 3.9, calcium 9.7, protein 7.0, albumin 4.9, total bilirubin 0.4, alkaline phosphatase 56, AST 15, ALT 25, A1c 5.8%, magnesium 2.4.  Copies of these labs are on his shadow chart.  He reported instructions to hold aspirin starting 12/24/2021.  Anesthesia team to evaluate on the day of surgery.   VS: BP 119/84   Pulse 70   Temp 36.7 C (Oral)   Resp 17   Ht 5\' 10"  (1.778 m)   Wt 99.9 kg   SpO2 95%   BMI 31.61 kg/m    PROVIDERS: Elder Negus- Sanders, David, PA-C is PCP [facility]([place], [place]) - Hoy FinlayAdams, George, MD is cardiologist [facility]). Last visit 10/09/21 to review reassuring stress test and echo (for chest pain, SOB), and carotid US (for bruit).  He was not having further chest pain or SOB. One year follow-up planned. - He saw  EP cardiologist Clydie BraunFitzgerald, David, MD [phone](1990's) and Sherryl MangesKlein, Steven, MD in 2011. Angelyn Punt- Bruggen, Joel, MD is GI ([facility]). Last visit 09/18/21.  Reflux symptoms had resolved since adding Pepcid to his omeprazole regimen and after resolution of his previous social stressor.  His next colonoscopy is due around March 2025 for follow-up adenoma history. Marlyn Corporal- Golenbiewski, Jon, DO is rheumatologist ([facility]). Last visit 05/08/21 for follow-up " undifferentiated connective tissue disease."  Continue to monitor off HCQ therapy.  3844-month follow-up planned.   LABS: See DISCUSSION. (all labs ordered are listed, but only abnormal results are displayed)  Labs Reviewed  SURGICAL PCR SCREEN - Abnormal; Notable for the following components:      Result Value   Staphylococcus aureus POSITIVE (*)    All other components within normal limits  GLUCOSE, CAPILLARY - Abnormal; Notable for the following components:   Glucose-Capillary 105 (*)    All other components within normal limits  TYPE AND SCREEN     IMAGES: CT L-spine with myelogram 12/04/21: IMPRESSION: - Worsening of findings compared to March of 2021. Slight enlargement of a broad-based disc herniation at L3-4 with slight caudal down turning in the midline. No definite neural compression however. - Chronic disc degeneration at the L4-5 level with worsening foraminal stenosis, particularly on the right, due to encroachment by osteophyte and disc material. - Chronic disc degeneration at the L5-S1 level with worsening foraminal narrowing, particularly on the right, due to encroachment by osteophytic disease.    EKG: 08/29/21 ([facility]): Copy on shadow chart. Per  Narrative in Care Everywhere: Sinus  Rhythm  -Poor R-wave progression -may be secondary to pulmonary disease   consider old anterior infarct.   Low voltage with rightward P-axis and rotation -possible pulmonary disease.    CV: Nuclear stress test 09/10/21 [facility]): Summary  1. Myocardial perfusion  imaging is normal.  2. Normal left ventricular size and ejection fraction.  3. Normal sinus rhythm - normal ECG.  4. No abnormal ST/T wave changes with stress.    Echo 09/05/21 [facility]): Summary    1. The left ventricle is normal in size with upper normal wall thickness.    2. The left ventricular systolic function is normal with no obvious regional  wall motion abnormalities, LVEF is visually estimated at 55-60%.    3. The right ventricle is mildly to moderately dilated in size, with normal  systolic function.    4. Mildly dilated aortic root ( 3.9 cm).   Normal size ascending aorta.    5. There are no significant valvular abnormalities.    US Carotid 09/05/21 [facility]): Final Impression    * <50% diameter reduction noted within the bilateral internal carotid  arteries.    * Antegrade Doppler flow noted in the bilateral vertebral arteries.    * The right subclavian artery demonstrated spectral waveforms without  evidence of stenosis.    * The left subclavian artery demonstrated spectral waveforms without  evidence of stenosis.    * Plaque is described below in findings. [See full report in Care Everywhere]     Wearable Telemetry 05/03/17 ([facility]): IMPRESSION: 1. Sinus with average rate of 78 BPM.  2. Multiple transmissions for palpitations - this correlated with PVCs.  3. No significant tachy or brady rhythms.     Past Medical History:  Diagnosis Date   Arthritis    BPH (benign prostatic hyperplasia)    Chronic kidney disease    Diabetes mellitus without complication (HCC)    Dysrhythmia    reported beta-adrenergic disorder/syndrome per [facility] EP 1994; PVCs 04/2017 monitor   Fatty liver    Fibromyalgia    GERD (gastroesophageal reflux disease)    Hemorrhoids    Hypertension    Joint pain    Kidney stones    PONV (postoperative nausea and vomiting)     Past Surgical History:  Procedure Laterality Date   BACK SURGERY     2004 nd 2012   CERVICAL FUSION  07/07/2008    LITHOTRIPSY     LUMBAR LAMINECTOMY/DECOMPRESSION MICRODISCECTOMY  08/08/2011   Procedure: LUMBAR LAMINECTOMY/DECOMPRESSION MICRODISCECTOMY;  Surgeon: Karn Cassis, MD;  Location: [facility] NEURO ORS;  Service: Neurosurgery;  Laterality: Right;  Right Lumbar Four-Five Diskectomy   ROTATOR CUFF REPAIR Right    sinus sugery     SPINAL CORD STIMULATOR IMPLANT  2014   TONSILLECTOMY      MEDICATIONS:  amLODipine (NORVASC) 5 MG tablet   aspirin EC 81 MG tablet   carvedilol (COREG) 25 MG tablet   cyclobenzaprine (FLEXERIL) 10 MG tablet   ergocalciferol (VITAMIN D2) 1.25 MG (50000 UT) capsule   famotidine (PEPCID) 40 MG tablet   HYDROmorphone (DILAUDID) 8 MG tablet   metFORMIN (GLUCOPHAGE-XR) 500 MG 24 hr tablet   omega-3 acid ethyl esters (LOVAZA) 1 g capsule   omeprazole (PRILOSEC) 40 MG capsule   pregabalin (LYRICA) 150 MG capsule   rOPINIRole (REQUIP) 0.5 MG tablet   rosuvastatin (CRESTOR) 20 MG tablet   topiramate (TOPAMAX) 25 MG capsule   triamterene-hydrochlorothiazide (MAXZIDE-25) 37.5-25 MG  tablet   XIIDRA 5 % SOLN   No current facility-administered medications for this encounter.    Shonna Chock, PA-C Surgical Short Stay/Anesthesiology [facility] Phone [phone] [facility] Phone [phone] 12/18/2021 5:11 PM

## 2021-12-18 NOTE — Anesthesia Preprocedure Evaluation (Signed)
Anesthesia Evaluation    Airway        Dental   Pulmonary           Cardiovascular hypertension,   Nuclear stress test 09/10/21 [facility]): Summary  1. Myocardial perfusion imaging is normal.  2. Normal left ventricular size and ejection fraction.  3. Normal sinus rhythm - normal ECG.  4. No abnormal ST/T wave changes with stress.    Echo 09/05/21 [facility]): Summary  1. The left ventricle is normal in size with upper normal wall thickness.  2. The left ventricular systolic function is normal with no obvious regional  wall motion abnormalities, LVEF is visually estimated at 55-60%.  3. The right ventricle is mildly to moderately dilated in size, with normal  systolic function.  4. Mildly dilated aortic root ( 3.9 cm).  Normal size ascending aorta.  5. There are no significant valvular abnormalities.    Neuro/Psych    GI/Hepatic   Endo/Other  diabetes  Renal/GU      Musculoskeletal   Abdominal   Peds  Hematology   Anesthesia Other Findings   Reproductive/Obstetrics                             Anesthesia Physical Anesthesia Plan  ASA:   Anesthesia Plan:    Post-op Pain Management:    Induction:   PONV Risk Score and Plan:   Airway Management Planned:   Additional Equipment:   Intra-op Plan:   Post-operative Plan:   Informed Consent:   Plan Discussed with:   Anesthesia Plan Comments: (See PAT note written 12/18/2021 by Shonna Chock, PA-C. Copies of recent labs and EKG are on his shadow chart.)        Anesthesia Quick Evaluation

## 2021-12-31 ENCOUNTER — Inpatient Hospital Stay (HOSPITAL_COMMUNITY): Payer: Medicare Other

## 2021-12-31 ENCOUNTER — Ambulatory Visit (HOSPITAL_COMMUNITY)
Admission: RE | Disposition: A | Discharge status: Home / Self Care | Payer: Self-pay | Source: Home / Self Care | Attending: Neurological Surgery

## 2021-12-31 ENCOUNTER — Inpatient Hospital Stay (HOSPITAL_COMMUNITY): Payer: Medicare Other | Admitting: Anesthesiology

## 2021-12-31 ENCOUNTER — Other Ambulatory Visit: Payer: Self-pay

## 2021-12-31 ENCOUNTER — Inpatient Hospital Stay (HOSPITAL_COMMUNITY): Payer: Medicare Other | Admitting: Vascular Surgery

## 2021-12-31 ENCOUNTER — Inpatient Hospital Stay (HOSPITAL_COMMUNITY)
Admission: RE | Admit: 2021-12-31 | Discharge: 2022-01-01 | DRG: 455 | Disposition: A | Discharge status: Home / Self Care | Payer: Medicare Other | Attending: Neurological Surgery | Admitting: Neurological Surgery

## 2021-12-31 ENCOUNTER — Encounter (HOSPITAL_COMMUNITY): Payer: Self-pay | Admitting: Neurological Surgery

## 2021-12-31 DIAGNOSIS — E1122 Type 2 diabetes mellitus with diabetic chronic kidney disease: Secondary | ICD-10-CM | POA: Diagnosis present

## 2021-12-31 DIAGNOSIS — M48062 Spinal stenosis, lumbar region with neurogenic claudication: Secondary | ICD-10-CM | POA: Diagnosis present

## 2021-12-31 DIAGNOSIS — I10 Essential (primary) hypertension: Secondary | ICD-10-CM | POA: Diagnosis not present

## 2021-12-31 DIAGNOSIS — Z79899 Other long term (current) drug therapy: Secondary | ICD-10-CM

## 2021-12-31 DIAGNOSIS — Z7984 Long term (current) use of oral hypoglycemic drugs: Secondary | ICD-10-CM | POA: Diagnosis not present

## 2021-12-31 DIAGNOSIS — Z6831 Body mass index (BMI) 31.0-31.9, adult: Secondary | ICD-10-CM

## 2021-12-31 DIAGNOSIS — M4316 Spondylolisthesis, lumbar region: Secondary | ICD-10-CM | POA: Diagnosis present

## 2021-12-31 DIAGNOSIS — M4727 Other spondylosis with radiculopathy, lumbosacral region: Secondary | ICD-10-CM | POA: Diagnosis present

## 2021-12-31 DIAGNOSIS — E119 Type 2 diabetes mellitus without complications: Secondary | ICD-10-CM | POA: Diagnosis present

## 2021-12-31 DIAGNOSIS — I129 Hypertensive chronic kidney disease with stage 1 through stage 4 chronic kidney disease, or unspecified chronic kidney disease: Secondary | ICD-10-CM | POA: Diagnosis present

## 2021-12-31 DIAGNOSIS — K219 Gastro-esophageal reflux disease without esophagitis: Secondary | ICD-10-CM | POA: Diagnosis present

## 2021-12-31 DIAGNOSIS — Z7982 Long term (current) use of aspirin: Secondary | ICD-10-CM

## 2021-12-31 DIAGNOSIS — M5416 Radiculopathy, lumbar region: Secondary | ICD-10-CM

## 2021-12-31 DIAGNOSIS — M4726 Other spondylosis with radiculopathy, lumbar region: Principal | ICD-10-CM | POA: Diagnosis present

## 2021-12-31 DIAGNOSIS — M797 Fibromyalgia: Secondary | ICD-10-CM | POA: Diagnosis present

## 2021-12-31 DIAGNOSIS — N189 Chronic kidney disease, unspecified: Secondary | ICD-10-CM | POA: Diagnosis present

## 2021-12-31 DIAGNOSIS — E669 Obesity, unspecified: Secondary | ICD-10-CM | POA: Diagnosis present

## 2021-12-31 DIAGNOSIS — M4807 Spinal stenosis, lumbosacral region: Secondary | ICD-10-CM | POA: Diagnosis present

## 2021-12-31 DIAGNOSIS — K760 Fatty (change of) liver, not elsewhere classified: Secondary | ICD-10-CM | POA: Diagnosis present

## 2021-12-31 DIAGNOSIS — M4317 Spondylolisthesis, lumbosacral region: Secondary | ICD-10-CM | POA: Diagnosis present

## 2021-12-31 DIAGNOSIS — N400 Enlarged prostate without lower urinary tract symptoms: Secondary | ICD-10-CM | POA: Diagnosis present

## 2021-12-31 LAB — GLUCOSE, CAPILLARY
Glucose-Capillary: 134 mg/dL — ABNORMAL HIGH (ref 70–99)
Glucose-Capillary: 108 mg/dL — ABNORMAL HIGH (ref 70–99)
Glucose-Capillary: 130 mg/dL — ABNORMAL HIGH (ref 70–99)
Glucose-Capillary: 151 mg/dL — ABNORMAL HIGH (ref 70–99)
Glucose-Capillary: 205 mg/dL — ABNORMAL HIGH (ref 70–99)

## 2021-12-31 LAB — ABO/RH: ABO/RH(D): A POS

## 2021-12-31 SURGERY — POSTERIOR LUMBAR FUSION 2 LEVEL
Anesthesia: General | Site: Back

## 2021-12-31 MED ORDER — OXYCODONE HCL 5 MG/5ML PO SOLN: 5.0000 mg | Freq: Once | ORAL | Status: DC | PRN

## 2021-12-31 MED ORDER — BUPIVACAINE HCL (PF) 0.5 % IJ SOLN
INTRAMUSCULAR | Status: AC
  Filled 2021-12-31: qty 30

## 2021-12-31 MED ORDER — MEPERIDINE HCL 25 MG/ML IJ SOLN: 6.2500 mg | INTRAMUSCULAR | Status: DC | PRN

## 2021-12-31 MED ORDER — PHENYLEPHRINE 80 MCG/ML (10ML) SYRINGE FOR IV PUSH (FOR BLOOD PRESSURE SUPPORT)
PREFILLED_SYRINGE | INTRAVENOUS | Status: DC | PRN
  Administered 2021-12-31: 80 ug via INTRAVENOUS
  Administered 2021-12-31: 40 ug via INTRAVENOUS
  Administered 2021-12-31: 80 ug via INTRAVENOUS

## 2021-12-31 MED ORDER — PROPOFOL 10 MG/ML IV BOLUS
INTRAVENOUS | Status: DC | PRN
  Administered 2021-12-31: 150 mg via INTRAVENOUS

## 2021-12-31 MED ORDER — HYDROMORPHONE HCL 1 MG/ML IJ SOLN: 1.0000 mg | INTRAMUSCULAR | Status: DC | PRN

## 2021-12-31 MED ORDER — CEFAZOLIN SODIUM-DEXTROSE 2-4 GM/100ML-% IV SOLN
2.0000 g | Freq: Three times a day (TID) | INTRAVENOUS | Status: AC
  Administered 2021-12-31 – 2022-01-01 (×2): 2 g via INTRAVENOUS
  Filled 2021-12-31 (×2): qty 100

## 2021-12-31 MED ORDER — METHOCARBAMOL 1000 MG/10ML IJ SOLN: 500.0000 mg | Freq: Four times a day (QID) | INTRAVENOUS | Status: DC | PRN

## 2021-12-31 MED ORDER — CEFAZOLIN SODIUM-DEXTROSE 2-4 GM/100ML-% IV SOLN
INTRAVENOUS | Status: AC
  Filled 2021-12-31: qty 100

## 2021-12-31 MED ORDER — ROCURONIUM BROMIDE 10 MG/ML (PF) SYRINGE
PREFILLED_SYRINGE | INTRAVENOUS | Status: AC
Start: 2021-12-31 — End: ?
  Filled 2021-12-31: qty 10

## 2021-12-31 MED ORDER — ACETAMINOPHEN 500 MG PO TABS: 1000.0000 mg | ORAL_TABLET | Freq: Once | ORAL | Status: AC

## 2021-12-31 MED ORDER — ROCURONIUM BROMIDE 10 MG/ML (PF) SYRINGE
PREFILLED_SYRINGE | INTRAVENOUS | Status: AC
  Filled 2021-12-31: qty 10

## 2021-12-31 MED ORDER — ROSUVASTATIN CALCIUM 20 MG PO TABS
20.0000 mg | ORAL_TABLET | Freq: Every day | ORAL | Status: DC
  Administered 2021-12-31: 20 mg via ORAL
  Filled 2021-12-31: qty 1

## 2021-12-31 MED ORDER — HYDROMORPHONE HCL 1 MG/ML IJ SOLN
INTRAMUSCULAR | Status: AC
  Filled 2021-12-31: qty 1

## 2021-12-31 MED ORDER — METFORMIN HCL ER 500 MG PO TB24
500.0000 mg | ORAL_TABLET | Freq: Every day | ORAL | Status: DC
  Filled 2021-12-31: qty 1

## 2021-12-31 MED ORDER — ORAL CARE MOUTH RINSE
15.0000 mL | Freq: Once | OROMUCOSAL | Status: AC
Start: 2021-12-31 — End: 2021-12-31

## 2021-12-31 MED ORDER — MIDAZOLAM HCL 2 MG/2ML IJ SOLN: 0.5000 mg | Freq: Once | INTRAMUSCULAR | Status: DC | PRN

## 2021-12-31 MED ORDER — POLYETHYLENE GLYCOL 3350 17 G PO PACK: 17.0000 g | PACK | Freq: Every day | ORAL | Status: DC | PRN

## 2021-12-31 MED ORDER — CEFAZOLIN SODIUM 1 G IJ SOLR
INTRAMUSCULAR | Status: AC
  Filled 2021-12-31: qty 20

## 2021-12-31 MED ORDER — THROMBIN 5000 UNITS EX SOLR
OROMUCOSAL | Status: DC | PRN
  Administered 2021-12-31: 5 mL via TOPICAL

## 2021-12-31 MED ORDER — HYDROMORPHONE HCL 8 MG PO TABS: 8.0000 mg | ORAL_TABLET | Freq: Three times a day (TID) | ORAL | Status: DC | PRN

## 2021-12-31 MED ORDER — METHOCARBAMOL 500 MG PO TABS
500.0000 mg | ORAL_TABLET | Freq: Four times a day (QID) | ORAL | Status: DC | PRN
  Administered 2021-12-31 – 2022-01-01 (×2): 500 mg via ORAL
  Filled 2021-12-31 (×2): qty 1

## 2021-12-31 MED ORDER — INSULIN ASPART 100 UNIT/ML IJ SOLN: 0.0000 [IU] | Freq: Three times a day (TID) | INTRAMUSCULAR | Status: DC

## 2021-12-31 MED ORDER — SODIUM CHLORIDE 0.9% FLUSH: 3.0000 mL | Freq: Two times a day (BID) | INTRAVENOUS | Status: DC

## 2021-12-31 MED ORDER — LIDOCAINE 2% (20 MG/ML) 5 ML SYRINGE
INTRAMUSCULAR | Status: DC | PRN
  Administered 2021-12-31: 100 mg via INTRAVENOUS

## 2021-12-31 MED ORDER — CHLORHEXIDINE GLUCONATE CLOTH 2 % EX PADS: 6.0000 | MEDICATED_PAD | Freq: Once | CUTANEOUS | Status: DC

## 2021-12-31 MED ORDER — ONDANSETRON HCL 4 MG PO TABS: 4.0000 mg | ORAL_TABLET | Freq: Four times a day (QID) | ORAL | Status: DC | PRN

## 2021-12-31 MED ORDER — 0.9 % SODIUM CHLORIDE (POUR BTL) OPTIME
TOPICAL | Status: DC | PRN
  Administered 2021-12-31: 1000 mL

## 2021-12-31 MED ORDER — LIDOCAINE 2% (20 MG/ML) 5 ML SYRINGE
INTRAMUSCULAR | Status: AC
Start: 2021-12-31 — End: ?
  Filled 2021-12-31: qty 5

## 2021-12-31 MED ORDER — CHLORHEXIDINE GLUCONATE 0.12 % MT SOLN
OROMUCOSAL | Status: AC
  Administered 2021-12-31: 15 mL via OROMUCOSAL
  Filled 2021-12-31: qty 15

## 2021-12-31 MED ORDER — DEXAMETHASONE SODIUM PHOSPHATE 10 MG/ML IJ SOLN
INTRAMUSCULAR | Status: DC | PRN
  Administered 2021-12-31: 10 mg via INTRAVENOUS

## 2021-12-31 MED ORDER — OMEGA-3-ACID ETHYL ESTERS 1 G PO CAPS
2.0000 g | ORAL_CAPSULE | Freq: Two times a day (BID) | ORAL | Status: DC
  Administered 2021-12-31 – 2022-01-01 (×2): 2 g via ORAL
  Filled 2021-12-31 (×2): qty 2

## 2021-12-31 MED ORDER — SENNA 8.6 MG PO TABS
1.0000 | ORAL_TABLET | Freq: Two times a day (BID) | ORAL | Status: DC
  Administered 2021-12-31 – 2022-01-01 (×2): 8.6 mg via ORAL
  Filled 2021-12-31 (×2): qty 1

## 2021-12-31 MED ORDER — ACETAMINOPHEN 500 MG PO TABS
ORAL_TABLET | ORAL | Status: AC
  Administered 2021-12-31: 1000 mg via ORAL
  Filled 2021-12-31: qty 2

## 2021-12-31 MED ORDER — CHLORHEXIDINE GLUCONATE 0.12 % MT SOLN: 15.0000 mL | Freq: Once | OROMUCOSAL | Status: AC

## 2021-12-31 MED ORDER — ONDANSETRON HCL 4 MG/2ML IJ SOLN
4.0000 mg | Freq: Four times a day (QID) | INTRAMUSCULAR | Status: DC | PRN
  Administered 2022-01-01: 4 mg via INTRAVENOUS
  Filled 2021-12-31: qty 2

## 2021-12-31 MED ORDER — AMLODIPINE BESYLATE 5 MG PO TABS
5.0000 mg | ORAL_TABLET | Freq: Every day | ORAL | Status: DC
  Administered 2022-01-01: 5 mg via ORAL
  Filled 2021-12-31: qty 1

## 2021-12-31 MED ORDER — ACETAMINOPHEN 650 MG RE SUPP: 650.0000 mg | RECTAL | Status: DC | PRN

## 2021-12-31 MED ORDER — PROMETHAZINE HCL 25 MG/ML IJ SOLN
12.5000 mg | Freq: Four times a day (QID) | INTRAMUSCULAR | Status: DC | PRN
Start: 2021-12-31 — End: 2021-12-31
  Administered 2021-12-31: 12.5 mg via INTRAVENOUS

## 2021-12-31 MED ORDER — MIDAZOLAM HCL 2 MG/2ML IJ SOLN
INTRAMUSCULAR | Status: DC | PRN
  Administered 2021-12-31: 2 mg via INTRAVENOUS

## 2021-12-31 MED ORDER — OXYCODONE HCL 5 MG PO TABS: 5.0000 mg | ORAL_TABLET | Freq: Once | ORAL | Status: DC | PRN

## 2021-12-31 MED ORDER — TOPIRAMATE 25 MG PO TABS
50.0000 mg | ORAL_TABLET | Freq: Every day | ORAL | Status: DC
  Administered 2021-12-31: 50 mg via ORAL
  Filled 2021-12-31: qty 2

## 2021-12-31 MED ORDER — PHENYLEPHRINE HCL-NACL 20-0.9 MG/250ML-% IV SOLN
INTRAVENOUS | Status: DC | PRN
  Administered 2021-12-31: 25 ug/min via INTRAVENOUS

## 2021-12-31 MED ORDER — FLEET ENEMA 7-19 GM/118ML RE ENEM: 1.0000 | ENEMA | Freq: Once | RECTAL | Status: DC | PRN

## 2021-12-31 MED ORDER — PANTOPRAZOLE SODIUM 40 MG PO TBEC
80.0000 mg | DELAYED_RELEASE_TABLET | Freq: Every day | ORAL | Status: DC
  Administered 2022-01-01: 80 mg via ORAL
  Filled 2021-12-31: qty 2

## 2021-12-31 MED ORDER — SODIUM CHLORIDE 0.9% FLUSH: 3.0000 mL | INTRAVENOUS | Status: DC | PRN

## 2021-12-31 MED ORDER — CEFAZOLIN SODIUM-DEXTROSE 2-4 GM/100ML-% IV SOLN
2.0000 g | INTRAVENOUS | Status: AC
  Administered 2021-12-31 (×2): 2 g via INTRAVENOUS

## 2021-12-31 MED ORDER — SODIUM CHLORIDE 0.9 % IV SOLN: 250.0000 mL | INTRAVENOUS | Status: DC

## 2021-12-31 MED ORDER — LACTATED RINGERS IV SOLN
INTRAVENOUS | Status: DC
Start: 2021-12-31 — End: 2022-01-01

## 2021-12-31 MED ORDER — BISACODYL 10 MG RE SUPP: 10.0000 mg | Freq: Every day | RECTAL | Status: DC | PRN

## 2021-12-31 MED ORDER — FENTANYL CITRATE (PF) 250 MCG/5ML IJ SOLN
INTRAMUSCULAR | Status: AC
  Filled 2021-12-31: qty 5

## 2021-12-31 MED ORDER — ROCURONIUM BROMIDE 10 MG/ML (PF) SYRINGE
PREFILLED_SYRINGE | INTRAVENOUS | Status: DC | PRN
  Administered 2021-12-31: 30 mg via INTRAVENOUS
  Administered 2021-12-31 (×3): 20 mg via INTRAVENOUS
  Administered 2021-12-31: 50 mg via INTRAVENOUS
  Administered 2021-12-31: 20 mg via INTRAVENOUS

## 2021-12-31 MED ORDER — LACTATED RINGERS IV SOLN: INTRAVENOUS | Status: DC

## 2021-12-31 MED ORDER — PHENOL 1.4 % MT LIQD: 1.0000 | OROMUCOSAL | Status: DC | PRN

## 2021-12-31 MED ORDER — LIDOCAINE-EPINEPHRINE 1 %-1:100000 IJ SOLN
INTRAMUSCULAR | Status: AC
Start: 2021-12-31 — End: ?
  Filled 2021-12-31: qty 1

## 2021-12-31 MED ORDER — ROPINIROLE HCL 1 MG PO TABS
1.0000 mg | ORAL_TABLET | Freq: Every evening | ORAL | Status: DC
  Administered 2021-12-31: 1 mg via ORAL
  Filled 2021-12-31: qty 1

## 2021-12-31 MED ORDER — THROMBIN 5000 UNITS EX SOLR
OROMUCOSAL | Status: DC | PRN
  Administered 2021-12-31 (×2): 5 mL via TOPICAL

## 2021-12-31 MED ORDER — THROMBIN 20000 UNITS EX SOLR: CUTANEOUS | Status: DC | PRN

## 2021-12-31 MED ORDER — PROMETHAZINE HCL 25 MG/ML IJ SOLN
INTRAMUSCULAR | Status: AC
  Filled 2021-12-31: qty 1

## 2021-12-31 MED ORDER — PREGABALIN 75 MG PO CAPS
150.0000 mg | ORAL_CAPSULE | Freq: Two times a day (BID) | ORAL | Status: DC
  Administered 2021-12-31 – 2022-01-01 (×2): 150 mg via ORAL
  Filled 2021-12-31 (×2): qty 2

## 2021-12-31 MED ORDER — ALUM & MAG HYDROXIDE-SIMETH 200-200-20 MG/5ML PO SUSP: 30.0000 mL | Freq: Four times a day (QID) | ORAL | Status: DC | PRN

## 2021-12-31 MED ORDER — MIDAZOLAM HCL 2 MG/2ML IJ SOLN
INTRAMUSCULAR | Status: AC
Start: 2021-12-31 — End: ?
  Filled 2021-12-31: qty 2

## 2021-12-31 MED ORDER — THROMBIN 5000 UNITS EX SOLR
CUTANEOUS | Status: AC
  Filled 2021-12-31: qty 5000

## 2021-12-31 MED ORDER — FAMOTIDINE 20 MG PO TABS
40.0000 mg | ORAL_TABLET | Freq: Every day | ORAL | Status: DC
  Administered 2021-12-31: 40 mg via ORAL
  Filled 2021-12-31: qty 2

## 2021-12-31 MED ORDER — LIDOCAINE-EPINEPHRINE 1 %-1:100000 IJ SOLN
INTRAMUSCULAR | Status: DC | PRN
  Administered 2021-12-31: 10 mL

## 2021-12-31 MED ORDER — HYDROMORPHONE HCL 2 MG PO TABS
4.0000 mg | ORAL_TABLET | ORAL | Status: DC | PRN
  Administered 2021-12-31 – 2022-01-01 (×3): 4 mg via ORAL
  Filled 2021-12-31 (×3): qty 2

## 2021-12-31 MED ORDER — SUGAMMADEX SODIUM 200 MG/2ML IV SOLN
INTRAVENOUS | Status: DC | PRN
  Administered 2021-12-31: 400 mg via INTRAVENOUS

## 2021-12-31 MED ORDER — FENTANYL CITRATE (PF) 250 MCG/5ML IJ SOLN
INTRAMUSCULAR | Status: DC | PRN
  Administered 2021-12-31: 50 ug via INTRAVENOUS
  Administered 2021-12-31: 100 ug via INTRAVENOUS
  Administered 2021-12-31: 50 ug via INTRAVENOUS

## 2021-12-31 MED ORDER — LIFITEGRAST 5 % OP SOLN: 1.0000 [drp] | Freq: Two times a day (BID) | OPHTHALMIC | Status: DC

## 2021-12-31 MED ORDER — DOCUSATE SODIUM 100 MG PO CAPS
100.0000 mg | ORAL_CAPSULE | Freq: Two times a day (BID) | ORAL | Status: DC
Start: 2021-12-31 — End: 2022-01-01
  Administered 2021-12-31 – 2022-01-01 (×2): 100 mg via ORAL
  Filled 2021-12-31 (×2): qty 1

## 2021-12-31 MED ORDER — ACETAMINOPHEN 325 MG PO TABS: 650.0000 mg | ORAL_TABLET | ORAL | Status: DC | PRN

## 2021-12-31 MED ORDER — TRIAMTERENE-HCTZ 37.5-25 MG PO TABS
1.0000 | ORAL_TABLET | Freq: Every day | ORAL | Status: DC
  Administered 2021-12-31 – 2022-01-01 (×2): 1 via ORAL
  Filled 2021-12-31 (×2): qty 1

## 2021-12-31 MED ORDER — MENTHOL 3 MG MT LOZG: 1.0000 | LOZENGE | OROMUCOSAL | Status: DC | PRN

## 2021-12-31 MED ORDER — BUPIVACAINE HCL (PF) 0.5 % IJ SOLN
INTRAMUSCULAR | Status: DC | PRN
  Administered 2021-12-31: 10 mL
  Administered 2021-12-31: 20 mL

## 2021-12-31 MED ORDER — INSULIN ASPART 100 UNIT/ML IJ SOLN: 0.0000 [IU] | INTRAMUSCULAR | Status: DC | PRN

## 2021-12-31 MED ORDER — METFORMIN HCL ER 500 MG PO TB24
500.0000 mg | ORAL_TABLET | Freq: Every day | ORAL | Status: DC
  Administered 2021-12-31: 500 mg via ORAL

## 2021-12-31 MED ORDER — ONDANSETRON HCL 4 MG/2ML IJ SOLN
INTRAMUSCULAR | Status: AC
  Filled 2021-12-31: qty 2

## 2021-12-31 MED ORDER — HYDROMORPHONE HCL 1 MG/ML IJ SOLN
0.2500 mg | INTRAMUSCULAR | Status: DC | PRN
  Administered 2021-12-31 (×2): 0.5 mg via INTRAVENOUS

## 2021-12-31 MED ORDER — ONDANSETRON HCL 4 MG/2ML IJ SOLN
INTRAMUSCULAR | Status: DC | PRN
  Administered 2021-12-31: 4 mg via INTRAVENOUS

## 2021-12-31 MED ORDER — CARVEDILOL 25 MG PO TABS
25.0000 mg | ORAL_TABLET | Freq: Two times a day (BID) | ORAL | Status: DC
  Administered 2022-01-01: 25 mg via ORAL
  Filled 2021-12-31: qty 1

## 2021-12-31 SURGICAL SUPPLY — 70 items
BAG COUNTER SPONGE SURGICOUNT (BAG) ×3 IMPLANT
BASKET BONE COLLECTION (BASKET) ×2 IMPLANT
BLADE BONE MILL MEDIUM (MISCELLANEOUS) ×2 IMPLANT
BLADE CLIPPER SURG (BLADE) ×1 IMPLANT
BONE CANC CHIPS 20CC PCAN1/4 (Bone Implant) ×2 IMPLANT
BUR MATCHSTICK NEURO 3.0 LAGG (BURR) ×2 IMPLANT
CAGE COROENT MP 8X9X23M-8 SPIN (Cage) ×2 IMPLANT
CAGE PLIF 8X9X23-12 LUMBAR (Cage) ×2 IMPLANT
CANISTER SUCT 3000ML PPV (MISCELLANEOUS) ×2 IMPLANT
CHIPS CANC BONE 20CC PCAN1/4 (Bone Implant) ×1 IMPLANT
CNTNR URN SCR LID CUP LEK RST (MISCELLANEOUS) ×1 IMPLANT
CONT SPEC 4OZ STRL OR WHT (MISCELLANEOUS) ×1
COVER BACK TABLE 60X90IN (DRAPES) ×2 IMPLANT
DERMABOND ADHESIVE PROPEN (GAUZE/BANDAGES/DRESSINGS) ×1
DERMABOND ADVANCED (GAUZE/BANDAGES/DRESSINGS) ×1
DERMABOND ADVANCED .7 DNX12 (GAUZE/BANDAGES/DRESSINGS) ×1 IMPLANT
DERMABOND ADVANCED .7 DNX6 (GAUZE/BANDAGES/DRESSINGS) IMPLANT
DEVICE DISSECT PLASMABLAD 3.0S (MISCELLANEOUS) ×1 IMPLANT
DRAPE C-ARM 42X72 X-RAY (DRAPES) ×4 IMPLANT
DRAPE HALF SHEET 40X57 (DRAPES) ×1 IMPLANT
DRAPE LAPAROTOMY 100X72X124 (DRAPES) ×2 IMPLANT
DRSG OPSITE POSTOP 4X8 (GAUZE/BANDAGES/DRESSINGS) ×1 IMPLANT
DURAPREP 26ML APPLICATOR (WOUND CARE) ×2 IMPLANT
DURASEAL APPLICATOR TIP (TIP) IMPLANT
DURASEAL SPINE SEALANT 3ML (MISCELLANEOUS) IMPLANT
ELECT REM PT RETURN 9FT ADLT (ELECTROSURGICAL) ×2
ELECTRODE REM PT RTRN 9FT ADLT (ELECTROSURGICAL) ×1 IMPLANT
GAUZE 4X4 16PLY ~~LOC~~+RFID DBL (SPONGE) IMPLANT
GAUZE SPONGE 4X4 12PLY STRL (GAUZE/BANDAGES/DRESSINGS) ×1 IMPLANT
GLOVE BIOGEL PI IND STRL 8.5 (GLOVE) ×2 IMPLANT
GLOVE BIOGEL PI INDICATOR 8.5 (GLOVE) ×2
GLOVE ECLIPSE 8.5 STRL (GLOVE) ×4 IMPLANT
GOWN STRL REUS W/ TWL LRG LVL3 (GOWN DISPOSABLE) IMPLANT
GOWN STRL REUS W/ TWL XL LVL3 (GOWN DISPOSABLE) IMPLANT
GOWN STRL REUS W/TWL 2XL LVL3 (GOWN DISPOSABLE) ×4 IMPLANT
GOWN STRL REUS W/TWL LRG LVL3 (GOWN DISPOSABLE)
GOWN STRL REUS W/TWL XL LVL3 (GOWN DISPOSABLE) ×3
GRAFT BNE CANC CHIPS 1-8 20CC (Bone Implant) IMPLANT
GRAFT BONE PROTEIOS LRG 5CC (Orthopedic Implant) ×1 IMPLANT
HEMOSTAT POWDER KIT SURGIFOAM (HEMOSTASIS) ×1 IMPLANT
KIT BASIN OR (CUSTOM PROCEDURE TRAY) ×2 IMPLANT
KIT GRAFTMAG DEL NEURO DISP (NEUROSURGERY SUPPLIES) IMPLANT
KIT TURNOVER KIT B (KITS) ×2 IMPLANT
MILL BONE PREP (MISCELLANEOUS) ×2 IMPLANT
NDL SPNL 18GX3.5 QUINCKE PK (NEEDLE) IMPLANT
NEEDLE HYPO 22GX1.5 SAFETY (NEEDLE) ×2 IMPLANT
NEEDLE SPNL 18GX3.5 QUINCKE PK (NEEDLE) IMPLANT
NS IRRIG 1000ML POUR BTL (IV SOLUTION) ×2 IMPLANT
PACK LAMINECTOMY NEURO (CUSTOM PROCEDURE TRAY) ×2 IMPLANT
PAD ARMBOARD 7.5X6 YLW CONV (MISCELLANEOUS) ×6 IMPLANT
PATTIES SURGICAL .5 X1 (DISPOSABLE) ×2 IMPLANT
PLASMABLADE 3.0S (MISCELLANEOUS) ×2
ROD RELIN-O LORD 5.5X65MM (Rod) ×2 IMPLANT
SCREW LOCK RELINE 5.5 TULIP (Screw) ×6 IMPLANT
SCREW RELINE-O POLY 6.5X45 (Screw) ×6 IMPLANT
SPIKE FLUID TRANSFER (MISCELLANEOUS) ×1 IMPLANT
SPONGE SURGIFOAM ABS GEL 100 (HEMOSTASIS) ×3 IMPLANT
SPONGE T-LAP 4X18 ~~LOC~~+RFID (SPONGE) IMPLANT
SUT PROLENE 6 0 BV (SUTURE) IMPLANT
SUT VIC AB 1 CT1 18XBRD ANBCTR (SUTURE) ×1 IMPLANT
SUT VIC AB 1 CT1 8-18 (SUTURE) ×1
SUT VIC AB 2-0 CP2 18 (SUTURE) ×2 IMPLANT
SUT VIC AB 3-0 SH 8-18 (SUTURE) ×2 IMPLANT
SUT VIC AB 4-0 RB1 18 (SUTURE) ×3 IMPLANT
SYR 3ML LL SCALE MARK (SYRINGE) ×8 IMPLANT
SYR 5ML LL (SYRINGE) IMPLANT
TOWEL GREEN STERILE (TOWEL DISPOSABLE) ×2 IMPLANT
TOWEL GREEN STERILE FF (TOWEL DISPOSABLE) ×2 IMPLANT
TRAY FOLEY MTR SLVR 16FR STAT (SET/KITS/TRAYS/PACK) ×2 IMPLANT
WATER STERILE IRR 1000ML POUR (IV SOLUTION) ×2 IMPLANT

## 2021-12-31 NOTE — OR Nursing (Signed)
1155 phoned Dr Danielle Dess to make him aware of patient allergy to vicryl suture stated ,"will look into"sutures held not opened

## 2022-01-01 DIAGNOSIS — E119 Type 2 diabetes mellitus without complications: Secondary | ICD-10-CM | POA: Diagnosis present

## 2022-01-01 DIAGNOSIS — M4726 Other spondylosis with radiculopathy, lumbar region: Secondary | ICD-10-CM | POA: Diagnosis present

## 2022-01-01 DIAGNOSIS — E669 Obesity, unspecified: Secondary | ICD-10-CM | POA: Diagnosis present

## 2022-01-01 DIAGNOSIS — M4727 Other spondylosis with radiculopathy, lumbosacral region: Secondary | ICD-10-CM | POA: Diagnosis present

## 2022-01-01 DIAGNOSIS — Z6831 Body mass index (BMI) 31.0-31.9, adult: Secondary | ICD-10-CM | POA: Diagnosis not present

## 2022-01-01 DIAGNOSIS — M4807 Spinal stenosis, lumbosacral region: Secondary | ICD-10-CM | POA: Diagnosis present

## 2022-01-01 DIAGNOSIS — K219 Gastro-esophageal reflux disease without esophagitis: Secondary | ICD-10-CM | POA: Diagnosis present

## 2022-01-01 DIAGNOSIS — N189 Chronic kidney disease, unspecified: Secondary | ICD-10-CM | POA: Diagnosis present

## 2022-01-01 DIAGNOSIS — E1122 Type 2 diabetes mellitus with diabetic chronic kidney disease: Secondary | ICD-10-CM | POA: Diagnosis present

## 2022-01-01 DIAGNOSIS — M797 Fibromyalgia: Secondary | ICD-10-CM | POA: Diagnosis present

## 2022-01-01 DIAGNOSIS — I129 Hypertensive chronic kidney disease with stage 1 through stage 4 chronic kidney disease, or unspecified chronic kidney disease: Secondary | ICD-10-CM | POA: Diagnosis present

## 2022-01-01 DIAGNOSIS — K760 Fatty (change of) liver, not elsewhere classified: Secondary | ICD-10-CM | POA: Diagnosis present

## 2022-01-01 DIAGNOSIS — M48062 Spinal stenosis, lumbar region with neurogenic claudication: Secondary | ICD-10-CM | POA: Diagnosis present

## 2022-01-01 DIAGNOSIS — M4317 Spondylolisthesis, lumbosacral region: Secondary | ICD-10-CM | POA: Diagnosis present

## 2022-01-01 DIAGNOSIS — M4316 Spondylolisthesis, lumbar region: Secondary | ICD-10-CM | POA: Diagnosis present

## 2022-01-01 DIAGNOSIS — Z7982 Long term (current) use of aspirin: Secondary | ICD-10-CM | POA: Diagnosis not present

## 2022-01-01 DIAGNOSIS — Z7984 Long term (current) use of oral hypoglycemic drugs: Secondary | ICD-10-CM | POA: Diagnosis not present

## 2022-01-01 DIAGNOSIS — Z79899 Other long term (current) drug therapy: Secondary | ICD-10-CM | POA: Diagnosis not present

## 2022-01-01 DIAGNOSIS — N400 Enlarged prostate without lower urinary tract symptoms: Secondary | ICD-10-CM | POA: Diagnosis present

## 2022-01-01 LAB — BASIC METABOLIC PANEL
Anion gap: 10 (ref 5–15)
BUN: 13 mg/dL (ref 6–20)
CO2: 25 mmol/L (ref 22–32)
Calcium: 9 mg/dL (ref 8.9–10.3)
Chloride: 107 mmol/L (ref 98–111)
Creatinine, Ser: 1.19 mg/dL (ref 0.61–1.24)
GFR, Estimated: >60 mL/min (ref >60)
Glucose, Bld: 161 mg/dL — ABNORMAL HIGH (ref 70–99)
Potassium: 4.2 mmol/L (ref 3.5–5.1)
Sodium: 142 mmol/L (ref 135–145)

## 2022-01-01 LAB — CBC
HCT: 40.7 % (ref 39.0–52.0)
Hemoglobin: 14.4 g/dL (ref 13.0–17.0)
MCH: 33 pg (ref 26.0–34.0)
MCHC: 35.4 g/dL (ref 30.0–36.0)
MCV: 93.3 fL (ref 80.0–100.0)
Platelets: 161 10*3/uL (ref 150–400)
RBC: 4.36 MIL/uL (ref 4.22–5.81)
RDW: 13.3 % (ref 11.5–15.5)
WBC: 16.7 10*3/uL — ABNORMAL HIGH (ref 4.0–10.5)
nRBC: 0 % (ref 0.0–0.2)

## 2022-01-01 LAB — GLUCOSE, CAPILLARY: Glucose-Capillary: 149 mg/dL — ABNORMAL HIGH (ref 70–99)

## 2022-01-01 MED ORDER — TAMSULOSIN HCL 0.4 MG PO CAPS
0.4000 mg | ORAL_CAPSULE | Freq: Every day | ORAL | Status: DC
Start: 2022-01-01 — End: 2022-01-01
  Administered 2022-01-01: 0.4 mg via ORAL
  Filled 2022-01-01: qty 1

## 2022-01-01 MED ORDER — HYDROMORPHONE HCL 4 MG PO TABS: 4.0000 mg | ORAL_TABLET | ORAL | 0 refills | Status: AC | PRN

## 2022-01-01 MED ORDER — CEPHALEXIN 500 MG PO CAPS: 500.0000 mg | ORAL_CAPSULE | Freq: Four times a day (QID) | ORAL | 0 refills | Status: AC

## 2022-01-01 MED FILL — Thrombin For Soln 5000 Unit: CUTANEOUS | Qty: 5000 | Status: AC

## 2022-01-01 NOTE — Discharge Summary (Signed)
Physician Discharge Summary  Patient ID: PEIGHTON EDGIN MRN: [id number] DOB/AGE: April 17, 1971 51 y.o.  Admit date: 12/31/2021 Discharge date: 01/01/2022  Admission Diagnoses: Lumbar stenosis with radiculopathy, neurogenic claudication.  History of laminectomy x3.  Discharge Diagnoses: Lumbar stenosis with radiculopathy.  Neurogenic claudication.  History of laminectomy x3.  Degenerative disc disease.  Diabetes mellitus type 2. Principal Problem:   Other spondylosis with radiculopathy, lumbar region Active Problems:   Lumbar stenosis with neurogenic claudication   Discharged Condition: good  Hospital Course: Patient was admitted to undergo surgical decompression which she tolerated well.  Laboratory studies have been stable postop.  Consults: None  Significant Diagnostic Studies: None  Treatments: surgery: Decompression arthrodesis L4-5 L5-S1 see op note  Discharge Exam: Blood pressure 104/69, pulse (!) 102, temperature 99.7 F (37.6 C), temperature source Oral, resp. rate 18, height 5\' 10"  (1.778 m), weight 99.8 kg, SpO2 96 %. Incision is clean and dry with dressing intact postoperative laboratory studies are stable motor function is intact with some slight tibialis anterior weakness on the right at 4 out of 5.  Disposition: Discharge disposition: 01-Home or Self Care       Discharge Instructions     Call MD for:  redness, tenderness, or signs of infection (pain, swelling, redness, odor or green/yellow discharge around incision site)   Complete by: As directed    Call MD for:  severe uncontrolled pain   Complete by: As directed    Call MD for:  temperature >100.4   Complete by: As directed    Diet - low sodium heart healthy   Complete by: As directed    Discharge wound care:   Complete by: As directed    Okay to shower. Do not apply salves or appointments to incision. No heavy lifting with the upper extremities greater than 10 pounds. May resume driving when not requiring  pain medication and patient feels comfortable with doing so.   Incentive spirometry RT   Complete by: As directed    Increase activity slowly   Complete by: As directed       Allergies as of 01/01/2022       Reactions   Other Other (See Comments)   Vicryl sutures caused abscess twice   Shellfish-derived Products Anaphylaxis        Medication List     TAKE these medications    amLODipine 5 MG tablet Commonly known as: NORVASC Take 5 mg by mouth daily.   aspirin EC 81 MG tablet Take 81 mg by mouth daily.   carvedilol 25 MG tablet Commonly known as: COREG Take 25 mg by mouth 2 (two) times daily with a meal.   cephALEXin 500 MG capsule Commonly known as: KEFLEX Take 1 capsule (500 mg total) by mouth 4 (four) times daily for 10 days.   cyclobenzaprine 10 MG tablet Commonly known as: FLEXERIL Take 5 mg by mouth 3 (three) times daily as needed for muscle spasms.   ergocalciferol 1.25 MG (50000 UT) capsule Commonly known as: VITAMIN D2 Take 50,000 Units by mouth every Sunday.   famotidine 40 MG tablet Commonly known as: PEPCID Take 40 mg by mouth at bedtime.   HYDROmorphone 8 MG tablet Commonly known as: DILAUDID Take 8 mg by mouth every 8 (eight) hours as needed for severe pain. What changed: Another medication with the same name was added. Make sure you understand how and when to take each.   HYDROmorphone 4 MG tablet Commonly known as: DILAUDID Take 1 tablet (4  mg total) by mouth every 3 (three) hours as needed for severe pain. What changed: You were already taking a medication with the same name, and this prescription was added. Make sure you understand how and when to take each.   metFORMIN 500 MG 24 hr tablet Commonly known as: GLUCOPHAGE-XR Take 500 mg by mouth every evening.   omega-3 acid ethyl esters 1 g capsule Commonly known as: LOVAZA Take 2 g by mouth 2 (two) times daily.   omeprazole 40 MG capsule Commonly known as: PRILOSEC Take 40 mg by  mouth at bedtime.   pregabalin 150 MG capsule Commonly known as: LYRICA Take 150 mg by mouth 2 (two) times daily.   rOPINIRole 0.5 MG tablet Commonly known as: REQUIP Take 1 mg by mouth every evening.   rosuvastatin 20 MG tablet Commonly known as: CRESTOR Take 20 mg by mouth at bedtime.   topiramate 25 MG capsule Commonly known as: TOPAMAX Take 50 mg by mouth at bedtime.   triamterene-hydrochlorothiazide 37.5-25 MG tablet Commonly known as: MAXZIDE-25 Take 1 tablet by mouth daily.   Xiidra 5 % Soln Generic drug: Lifitegr[employer] Place 1 drop into both eyes 2 (two) times daily.               Discharge Care Instructions  (From admission, onward)           Start     Ordered   01/01/22 0000  Discharge wound care:       Comments: Okay to shower. Do not apply salves or appointments to incision. No heavy lifting with the upper extremities greater than 10 pounds. May resume driving when not requiring pain medication and patient feels comfortable with doing so.   01/01/22 0921             Signed: Shary Key Kea Callan 01/01/2022, 9:21 AM

## 2022-01-01 NOTE — Evaluation (Signed)
Physical Therapy Evaluation Patient Details Name: [name] [name] MRN: [id number] DOB: 24-Dec-1970 Today's Date: 01/01/2022  History of Present Illness  51 yo male s/p 6/27 Bil Laminectomies L4-5 L5-1 with decomplression of nerve pedicle screw fixation L4-5 and sacrum with posterolateral arthordesis PMH arthritis, DM, fibromyalgia HTN, kidney stones, back surgery 2004 2012 cervical fusion, rotator cuff R  Clinical Impression  PTA pt living with wife in 2 story home with bed and bath on 1st floor and 3 steps to enter. Pt limited by his back pain and RLE pain and was walking with AD. Pt currently mod I for bed mobility, transfers and ambulation, and supervision for stairs. Pt with no PT needs at discharge. Pt to inquire about outpatient PT at follow up appointment at surgeon, if R LE strength does not return more fully.      Recommendations for follow up therapy are one component of a multi-disciplinary discharge planning process, led by the attending physician.  Recommendations may be updated based on patient status, additional functional criteria and insurance authorization.  Follow Up Recommendations No PT follow up      Assistance Recommended at Discharge Intermittent Supervision/Assistance  Patient can return home with the following  A little help with bathing/dressing/bathroom;Assistance with cooking/housework;Help with stairs or ramp for entrance;Assist for transportation    Equipment Recommendations  None     Functional Status Assessment Patient has had a recent decline in their functional status and demonstrates the ability to make significant improvements in function in a reasonable and predictable amount of time.     Precautions / Restrictions Precautions Precautions: Back Precaution Booklet Issued: Yes (comment) Precaution Comments: able to recall 2/3 precautions, Required Braces or Orthoses: Spinal Brace Spinal Brace: Lumbar corset;Applied in sitting  position Restrictions Weight Bearing Restrictions: No      Mobility  Bed Mobility Overal bed mobility: Modified Independent                  Transfers Overall transfer level: Modified independent                 General transfer comment: vc for hand placement for power up    Ambulation/Gait Ambulation/Gait assistance: Modified independent (Device/Increase time) Gait Distance (Feet): 200 Feet Assistive device: Rolling walker (2 wheels) Gait Pattern/deviations: Knee flexed in stance - right, Decreased step length - left, Decreased stance time - right, Decreased dorsiflexion - right, Decreased weight shift to right Gait velocity: slowed Gait velocity interpretation: 1.31 - 2.62 ft/sec, indicative of limited community ambulator   General Gait Details: increased time and effort required, R LE weakness evident by decreased weightshift onto R  Stairs Stairs: Yes Stairs assistance: Supervision Stair Management: One rail Left Number of Stairs: 3 General stair comments: explained up with good and down with bad sequencing, pt reports he was going up with L and down with L LE, however during trainging evident that going down with RLE safer and does not increase pain as much     Balance Overall balance assessment: Mild deficits observed, not formally tested                                           Pertinent Vitals/Pain Pain Assessment Pain Assessment: 0-10 Pain Score: 5  Pain Location: back Pain Descriptors / Indicators: Grimacing, Guarding, Sore Pain Intervention(s): Limited activity within patient's tolerance, Monitored during session, Repositioned, Patient requesting pain  meds-RN notified    Home Living Family/patient expects to be discharged to:: Private residence Living Arrangements: Spouse/significant other   Type of Home: House Home Access: Stairs to enter Entrance Stairs-Rails: Left Entrance Stairs-Number of Steps: 3 Alternate Level  Stairs-Number of Steps: 12 Home Layout: Two level;Able to live on main level with bedroom/bathroom Home Equipment: Shower seat - built [employer] fundraiser (2 wheels)      Prior Function Prior Level of Function : Independent/Modified Independent             Mobility Comments: able to mobilize with RW due to increased back pain and R LE weakness ADLs Comments: independent in ADLs,        Extremity/Trunk Assessment   Upper Extremity Assessment Upper Extremity Assessment: Defer to OT evaluation    Lower Extremity Assessment Lower Extremity Assessment: RLE deficits/detail;LLE deficits/detail RLE Deficits / Details: hip flexion, knee extension and flexion ROM limited by decreased strength 2+/5, plantarflexion ROM WFL strength 3+/5 RLE Coordination: decreased fine motor LLE Deficits / Details: hip, knee and ankle ROM WFL strength grossly 3+/5    Cervical / Trunk Assessment Cervical / Trunk Assessment: Back Surgery  Communication   Communication: No difficulties  Cognition Arousal/Alertness: Awake/alert Behavior During Therapy: WFL for tasks assessed/performed Overall Cognitive Status: Within Functional Limits for tasks assessed                                          General Comments General comments (skin integrity, edema, etc.): educated on car transfers and need for hourly movement, and pain control        Assessment/Plan    PT Assessment Patient does not need any further PT services         PT Goals (Current goals can be found in the Care Plan section)  Acute Rehab PT Goals Patient Stated Goal: strengthen R LE PT Goal Formulation: With patient/family     AM-PAC PT "6 Clicks" Mobility  Outcome Measure Help needed turning from your back to your side while in a flat bed without using bedrails?: None Help needed moving from lying on your back to sitting on the side of a flat bed without using bedrails?: None Help needed moving to and from a bed  to a chair (including a wheelchair)?: None Help needed standing up from a chair using your arms (e.g., wheelchair or bedside chair)?: None Help needed to walk in hospital room?: None Help needed climbing 3-5 steps with a railing? : A Little 6 Click Score: 23    End of Session Equipment Utilized During Treatment: Back brace Activity Tolerance: Patient limited by pain Patient left: with call bell/phone within reach;with family/visitor present;Other (comment) (sitting EoB) Nurse Communication: Mobility status;Patient requests pain meds PT Visit Diagnosis: Muscle weakness (generalized) (M62.81);Other abnormalities of gait and mobility (R26.89);Pain Pain - part of body:  (back)    Time: 2440-1027 PT Time Calculation (min) (ACUTE ONLY): 19 min   Charges:   PT Evaluation $PT Eval Moderate Complexity: 1 Mod          Genavie Boettger B. Beverely Risen PT, DPT Acute Rehabilitation Services Please use secure chat or  Call Office [phone]   Elon Alas [facility] 01/01/2022, 10:39 AM

## 2022-01-01 NOTE — Progress Notes (Signed)
Patient ID: [name] [name], male   DOB: [birth year], 51 y.o.   MRN: [id number] Vital signs are stable Patient is awake alert and ambulating well He notes that he has some minimal weakness in the right lower extremity His incision is clean and dry He is ambulatory Is being managed well with his pain medication He will be discharged home today Postoperative laboratories revealed his hematocrit is 40 electrolytes are stable blood sugar slightly elevated 150.

## 2022-01-01 NOTE — Plan of Care (Signed)
Pt doing well. Pt and wife given D/C instructions with verbal understanding. Rx's were sent to the pharmacy by MD. Pt's incision is clean and dry with no sign of infection. Pt's IV was removed prior to D/C. Pt D/C'd home via wheelchair per MD order. Pt is stable @ D/C and has no other needs at this time. Elaina Cara, RN  

## 2022-01-01 NOTE — Evaluation (Addendum)
Occupational Therapy Evaluation Patient Details Name: [name] [name] MRN: [id number] DOB: 09/14/1970 Today's Date: 01/01/2022   History of Present Illness 51 yo male s/p 6/27 Bil Laminectomies L4-5 L5-1 with decomplression of nerve pedicle screw fixation L4-5 and sacrum with posterolateral arthordesis PMH arthritis, DM, fibromyalgia HTN, kidney stones, back surgery 2004 2012 cervical fusion, rotator cuff R   Clinical Impression   Patient is s/p back surgery resulting in functional limitations due to the deficits listed below (see OT problem list). Pt currently with R LE weakness but able to progress with RW. Pt reports pain 5/10 throughout session and denies any pain medications from RN staff when asked. Pt at adequate level to d/c from OT stand point. Pt dressed during session and agreeable to home d/c.  Patient will benefit from skilled OT acutely to increase independence and safety with ADLS to allow discharge home.       Recommendations for follow up therapy are one component of a multi-disciplinary discharge planning process, led by the attending physician.  Recommendations may be updated based on patient status, additional functional criteria and insurance authorization.   Follow Up Recommendations  No OT follow up    Assistance Recommended at Discharge PRN  Patient can return home with the following A little help with bathing/dressing/bathroom;Assist for transportation    Functional Status Assessment  Patient has had a recent decline in their functional status and demonstrates the ability to make significant improvements in function in a reasonable and predictable amount of time.  Equipment Recommendations  None recommended by OT    Recommendations for Other Services       Precautions / Restrictions Precautions Precautions: Back Precaution Booklet Issued: Yes (comment) Precaution Comments: back handout provided and reviewed with pt for adls. Required Braces or Orthoses:  Spinal Brace Spinal Brace: Lumbar corset;Applied in sitting position Restrictions Weight Bearing Restrictions: No      Mobility Bed Mobility Overal bed mobility: Modified Independent             General bed mobility comments: cues for log rolling with feedback on back precautions given. pt states 'oh yeah"    Transfers Overall transfer level: Modified independent Equipment used: Rolling walker (2 wheels)               General transfer comment: cues for hand placement to push from bed and reach for bed surface to powre up      Balance Overall balance assessment: Mild deficits observed, not formally tested                                         ADL either performed or assessed with clinical judgement   ADL Overall ADL's : Needs assistance/impaired Eating/Feeding: Independent   Grooming: Independent Grooming Details (indicate cue type and reason): educated on sink level and positioning. Upper Body Bathing: Independent   Lower Body Bathing: Supervison/ safety Lower Body Bathing Details (indicate cue type and reason): educated on long handle sponge. wife to purchase Upper Body Dressing : Independent   Lower Body Dressing: Supervision/safety Lower Body Dressing Details (indicate cue type and reason): able to figure 4 cross and don LB during session including socks and shoes Toilet Transfer: Radiographer, therapeutic Details (indicate cue type and reason): educated on leaving toilet lid up to prevent bending Toileting- Clothing Manipulation and Hygiene: Supervision/safety Toileting - Clothing Manipulation Details (indicate cue type and reason):  educated on driving over toilet frame with RW     Functional mobility during ADLs: Supervision/safety;Rolling walker (2 wheels) General ADL Comments: pt reports R LE feeling not to baseline but denies BIL LE pain today. pt reports feeling better with transfer this session compared to PM trial 6/27   Back handout provided and reviewed adls in detail. Pt educated on: clothing between brace, never sleep in brace, set an alarm at night for medication, avoid sitting for long periods of time, correct bed positioning for sleeping, correct sequence for bed mobility, avoiding lifting more than 5 pounds and never wash directly over incision. All education is complete and patient indicates understanding.    Vision Baseline Vision/History: 1 Wears glasses (contacts) Ability to See in Adequate Light: 1 Impaired Patient Visual Report: No change from baseline       Perception     Praxis      Pertinent Vitals/Pain Pain Assessment Pain Assessment: 0-10 Pain Score: 5  Pain Location: back Pain Descriptors / Indicators: Grimacing, Guarding, Sore Pain Intervention(s): Monitored during session, Repositioned     Hand Dominance Right   Extremity/Trunk Assessment Upper Extremity Assessment Upper Extremity Assessment: Overall WFL for tasks assessed;LUE deficits/detail LUE Deficits / Details: reports pending L shoulder repair   Lower Extremity Assessment Lower Extremity Assessment: Defer to PT evaluation;RLE deficits/detail RLE Deficits / Details: reports decreased strength in R LE during assessment. pt denies any buckling RLE Coordination: decreased fine motor LLE Deficits / Details: hip, knee and ankle ROM WFL strength grossly 3+/5   Cervical / Trunk Assessment Cervical / Trunk Assessment: Back Surgery   Communication Communication Communication: No difficulties   Cognition Arousal/Alertness: Awake/alert Behavior During Therapy: WFL for tasks assessed/performed Overall Cognitive Status: Within Functional Limits for tasks assessed                                       General Comments  educated on car transfers and need for hourly movement, and pain control    Exercises     Shoulder Instructions      Home Living Family/patient expects to be discharged to:: Private  residence Living Arrangements: Spouse/significant other   Type of Home: House Home Access: Stairs to enter [employer] of Steps: 3 Entrance Stairs-Rails: Left Home Layout: Two level;Able to live on main level with bedroom/bathroom Alternate Level Stairs-Number of Steps: 12 Alternate Level Stairs-Rails: Right Bathroom Shower/Tub: Producer, television/film/video: Standard Bathroom Accessibility: Yes   Home Equipment: Shower seat - built Charity fundraiser (2 wheels);Other (comment) (adjustable bed with controls for HOB and foot of bed)   Additional Comments: dog named Spots ( beagle) works as a Art therapist Prior Level of Function : Independent/Modified Independent;Driving;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)             Mobility Comments: able to mobilize with RW due to increased back pain and R LE weakness ADLs Comments: independent in ADLs,        OT Problem List: Decreased strength;Decreased activity tolerance;Impaired balance (sitting and/or standing);Decreased safety awareness;Decreased knowledge of use of DME or AE;Decreased knowledge of precautions      OT Treatment/Interventions: Self-care/ADL training;Therapeutic exercise;DME and/or AE instruction;Manual therapy;Modalities;Therapeutic activities;Patient/family education;Balance training    OT Goals(Current goals can be found in the care plan section) Acute Rehab OT Goals Patient Stated Goal: 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 OT  Goal Formulation: With patient/family Time For Goal Achievement: 01/15/22 Potential to Achieve Goals: Good ADL Goals Pt Will Transfer to Toilet: Independently;ambulating Additional ADL Goal #1: pt will demonstrate back precautions with sink level adl 100% correct Additional ADL Goal #2: pt will complete bed mobility mod I  OT Frequency: Min 2X/week    Co-evaluation              AM-PAC OT "6 Clicks" Daily Activity     Outcome Measure  Help from another person eating meals?: None Help from another person taking care of personal grooming?: None Help from another person toileting, which includes using toliet, bedpan, or urinal?: None Help from another person bathing (including washing, rinsing, drying)?: None Help from another person to put on and taking off regular upper body clothing?: None Help from another person to put on and taking off regular lower body clothing?: None 6 Click Score: 24   End of Session Equipment Utilized During Treatment: Rolling walker (2 wheels);Back brace Nurse Communication: Mobility status;Precautions  Activity Tolerance: Patient tolerated treatment well Patient left: in bed;with call bell/phone within reach;with family/visitor present  OT Visit Diagnosis: Unsteadiness on feet (R26.81);Muscle weakness (generalized) (M62.81)                Time: 0822-0904 OT Time Calculation (min): 42 min Charges:  OT General Charges $OT Visit: 1 Visit OT Evaluation $OT Eval Moderate Complexity: 1 Mod OT Treatments $Self Care/Home Management : 23-37 mins   Brynn, OTR/L  Acute Rehabilitation Services Office: [phone] .   Mateo Flow 01/01/2022, 11:14 AM

## 2022-01-02 LAB — HEMOGLOBIN A1C
Hgb A1c MFr Bld: 5.9 % — ABNORMAL HIGH (ref 4.8–5.6)
Mean Plasma Glucose: 123 mg/dL

## 2022-01-02 NOTE — Anesthesia Postprocedure Evaluation (Signed)
Anesthesia Post Note  Patient: [name] [name]  Procedure(s) Performed: Lumbar four -Lumbar five-Sacral one Posterior Lumbar Interbody Fusion (Back)     Patient location during evaluation: PACU Anesthesia Type: General Level of consciousness: awake and alert Pain management: pain level controlled Vital Signs Assessment: post-procedure vital signs reviewed and stable Respiratory status: spontaneous breathing, nonlabored ventilation, respiratory function stable and patient connected to nasal cannula oxygen Cardiovascular status: blood pressure returned to baseline and stable Postop Assessment: no apparent nausea or vomiting Anesthetic complications: no   No notable events documented.  Last Vitals:  Vitals:   01/01/22 0453 01/01/22 0745  BP: 109/65 104/69  Pulse: 91 (!) 102  Resp: 18 18  Temp: 36.8 C 37.6 C  SpO2: 97% 96%    Last Pain:  Vitals:   01/01/22 1025  TempSrc:   PainSc: 4                  Kennieth Rad

## 2022-01-10 MED FILL — Sodium Chloride Irrigation Soln 0.9%: Qty: 3000 | Status: AC

## 2022-01-10 MED FILL — Heparin Sodium (Porcine) Inj 1000 Unit/ML: INTRAMUSCULAR | Qty: 30 | Status: AC

## 2022-01-10 MED FILL — Sodium Chloride IV Soln 0.9%: INTRAVENOUS | Qty: 1000 | Status: AC

## 2024-03-31 IMAGING — RF DG MYELOGRAM LUMBAR
13 of 16 series · 13 of 16 positions shown · non-contrast
Comparison: 02/29/2020.  09/15/2019

CLINICAL DATA: Lumbar post-laminectomy syndrome.  Low back pain.

EXAM:
LUMBAR MYELOGRAM
FLUOROSCOPY:
0 minutes 54 seconds.  1471.45 micro gray meter squared
PROCEDURE:
Lumbar puncture and contrast injection was performed by Dr. Tigrane.
I personally performed  acquisition of the myelogram images.
TECHNIQUE: Contiguous axial images were obtained through the Lumbar spine after
the intrathecal infusion of infusion. Coronal and sagittal
reconstructions were obtained of the axial image sets.

[Series 1: cp_standard · 0.18mm/px · 1 of 1 slices shown (1 of 2)]
[im 1/1]
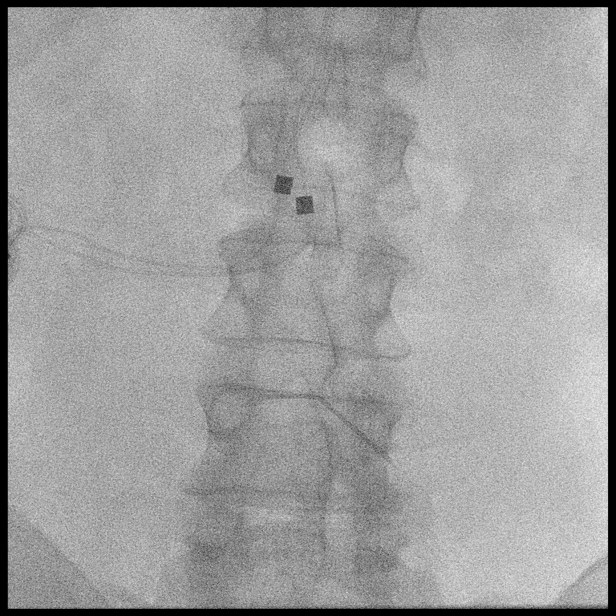

[Series 2: cp_standard · 0.19mm/px · 1 of 1 slices shown (2 of 2)]
[im 1/1]
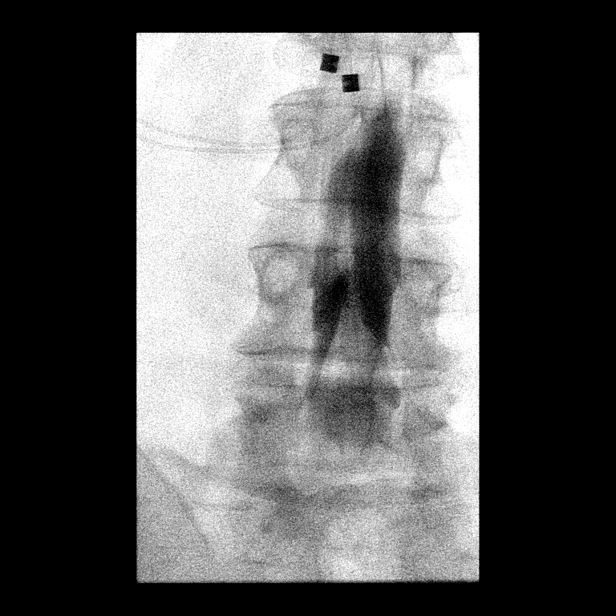

[Series 4: fluoro_myelogram_singleshot_bw · 0.19mm/px · 1 of 1 slices shown (1 of 11)]
[im 1/1]
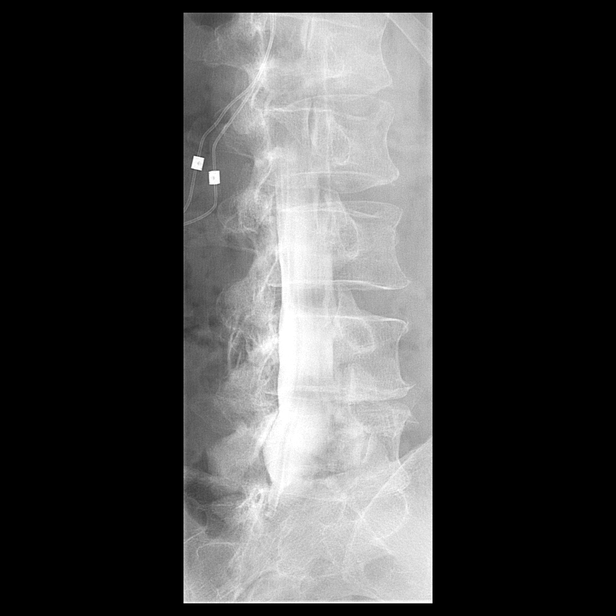

[Series 5: fluoro_myelogram_singleshot_bw · 0.19mm/px · 1 of 1 slices shown (2 of 11)]
[im 1/1]
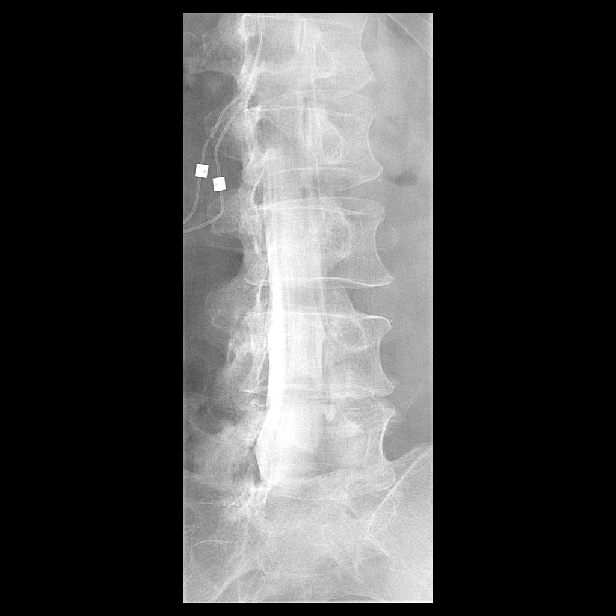

[Series 6: fluoro_myelogram_singleshot_bw · 0.19mm/px · 1 of 1 slices shown (3 of 11)]
[im 1/1]
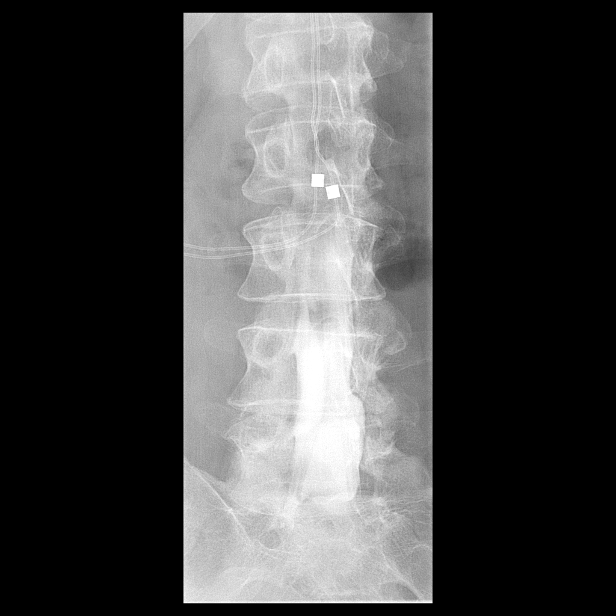

[Series 7: fluoro_myelogram_singleshot_bw · 0.19mm/px · 1 of 1 slices shown (4 of 11)]
[im 1/1]
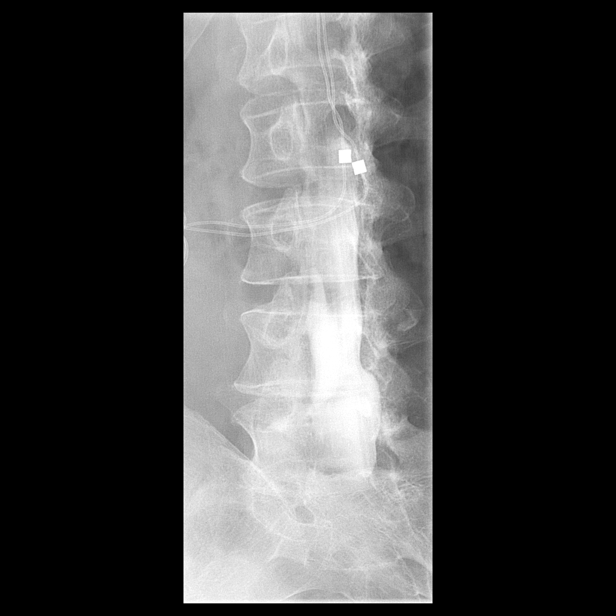

[Series 9: fluoro_myelogram_singleshot_bw · 0.20mm/px · 1 of 1 slices shown (5 of 11)]
[im 1/1]
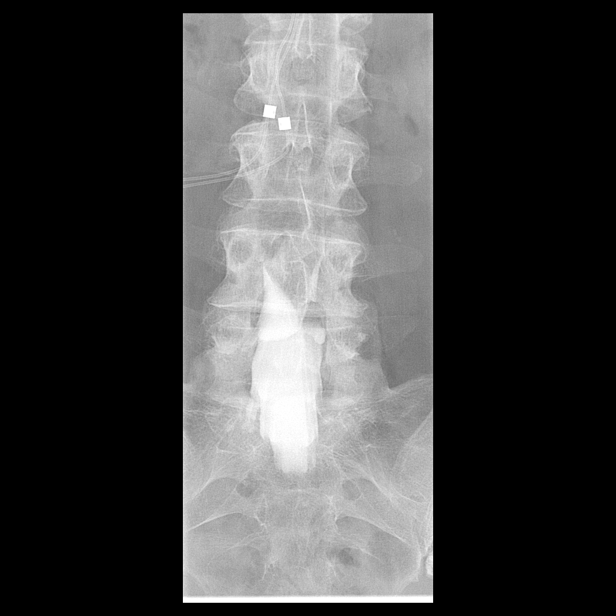

[Series 10: fluoro_myelogram_singleshot_bw · 0.20mm/px · 1 of 1 slices shown (6 of 11)]
[im 1/1]
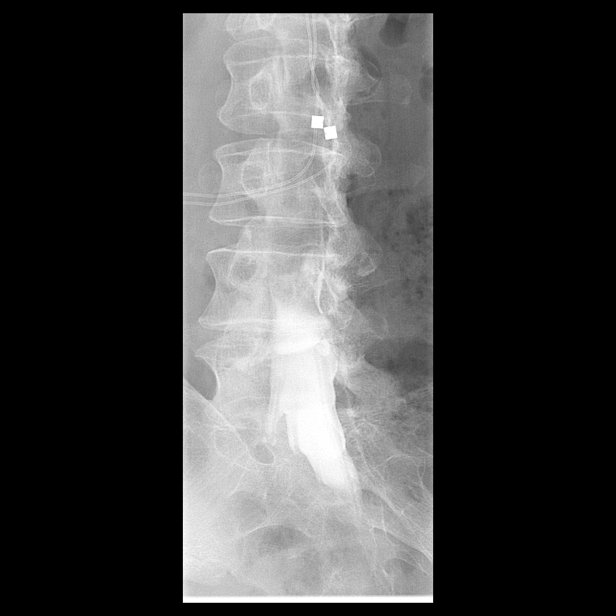

[Series 11: fluoro_myelogram_singleshot_bw · 0.20mm/px · 1 of 1 slices shown (7 of 11)]
[im 1/1]
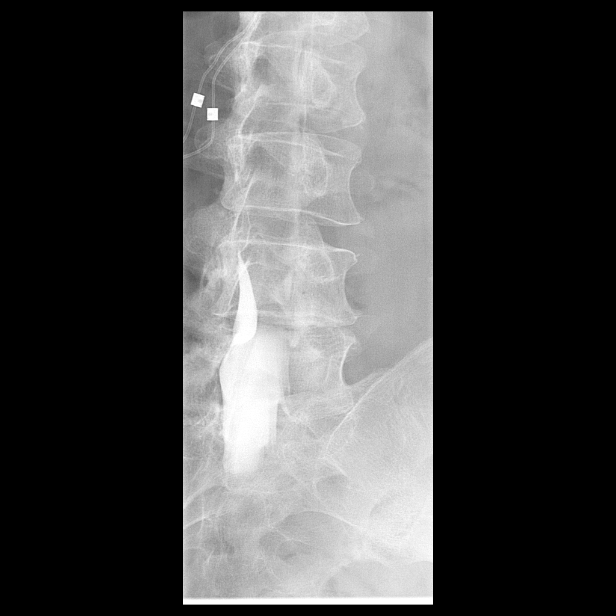

[Series 12: fluoro_myelogram_singleshot_bw · 0.19mm/px · 1 of 1 slices shown (8 of 11)]
[im 1/1]
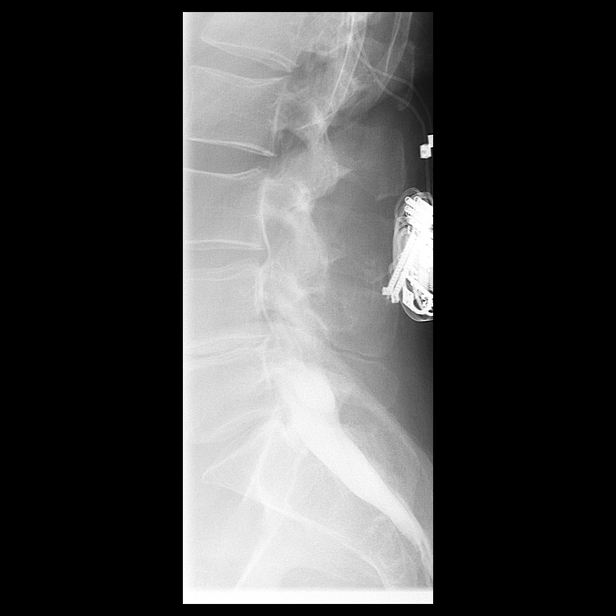

[Series 13: fluoro_myelogram_singleshot_bw · 0.19mm/px · 1 of 1 slices shown (9 of 11)]
[im 1/1]
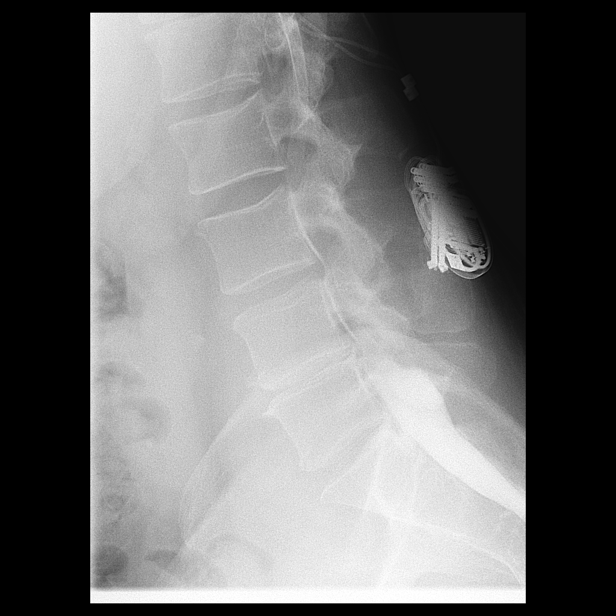

[Series 15: fluoro_myelogram_singleshot_bw · 0.19mm/px · 1 of 1 slices shown (10 of 11)]
[im 1/1]
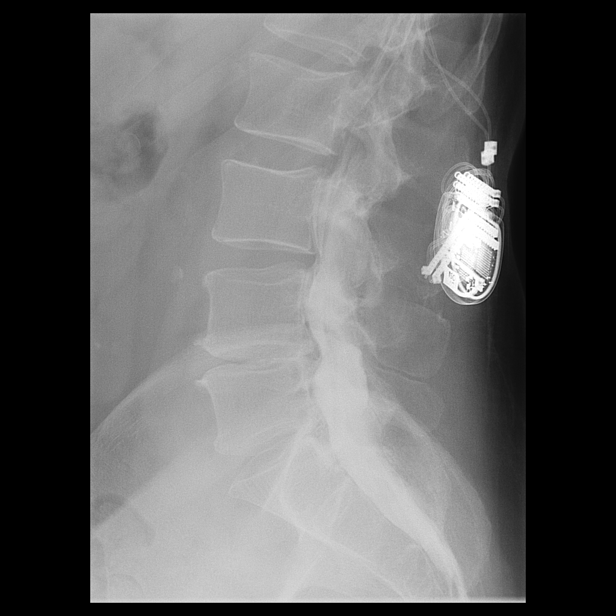

[Series 16: fluoro_myelogram_singleshot_bw · 0.19mm/px · 1 of 1 slices shown (11 of 11)]
[im 1/1]
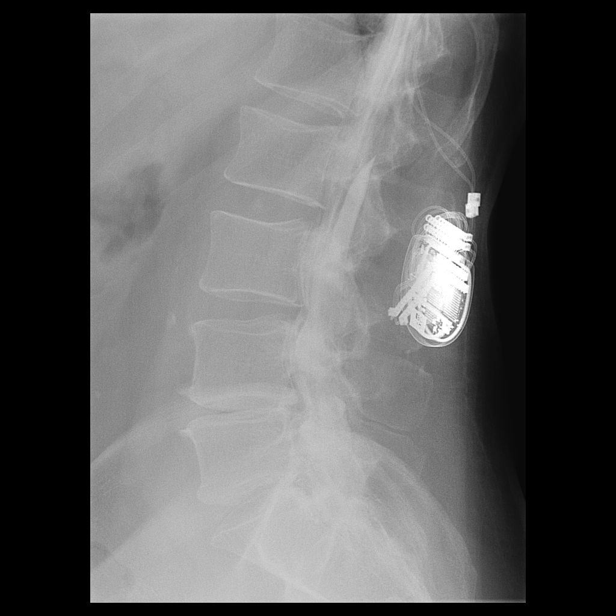

[13 of 16 positions shown; findings below may reference images not displayed]

FINDINGS: LUMBAR MYELOGRAM FINDINGS:

There are anterior extradural defects at L3-4, L4-5 and L5-S1. Disc
space narrowing is most pronounced at L4-5. No definite focal nerve
root compression demonstrable at myelography. Standing flexion
extension views do not show abnormal motion.

CT LUMBAR MYELOGRAM FINDINGS:

No abnormality seen from T10-11 through L2-3. Neurostimulator is in
place entering the interlaminar space at T12-L1 and ascending in the
dorsal midline without visible complicating feature.

L3-4: Disc degeneration with shallow protrusion with slight caudal
down turning in the midline. This indents the thecal sac but does
not cause visible neural compression. This has worsened slightly
since 4442.

L4-5: Previous hemilaminectomy. Chronic disc degeneration with loss
of disc height and vacuum phenomenon. Endplate osteophytes and
bulging of disc material. No compressive canal stenosis. Bilateral
foraminal narrowing right more than left due to osteophytic
encroachment could affect the exiting L4 nerves, particularly the
right.

L5-S1: Chronic disc degeneration with loss of disc height and vacuum
phenomenon. Mild bulging of the disc. No central canal or
subarticular lateral recess stenosis. Foraminal narrowing worse on
the right than the left, with worsening osteophytic encroachment
upon the foramen on the right.
IMPRESSION: Worsening of findings compared to Thursday September, 2019. Slight enlargement
of a broad-based disc herniation at L3-4 with slight caudal down
turning in the midline. No definite neural compression however.

Chronic disc degeneration at the L4-5 level with worsening foraminal
stenosis, particularly on the right, due to encroachment by
osteophyte and disc material.

Chronic disc degeneration at the L5-S1 level with worsening
foraminal narrowing, particularly on the right, due to encroachment
by osteophytic disease.
